# Patient Record
Sex: Female | Born: 1970 | Race: White | Hispanic: No | Marital: Married | Smoking: Never smoker
Health system: Southern US, Community
[De-identification: ages and names within clinical notes are randomized; demographics above are authoritative.]

## PROBLEM LIST (undated history)

## (undated) ENCOUNTER — Emergency Department (HOSPITAL_BASED_OUTPATIENT_CLINIC_OR_DEPARTMENT_OTHER): Admission: EM | Payer: BC Managed Care – PPO | Source: Home / Self Care

## (undated) DIAGNOSIS — R32 Unspecified urinary incontinence: Secondary | ICD-10-CM

## (undated) DIAGNOSIS — F419 Anxiety disorder, unspecified: Secondary | ICD-10-CM

## (undated) DIAGNOSIS — F329 Major depressive disorder, single episode, unspecified: Secondary | ICD-10-CM

## (undated) DIAGNOSIS — K219 Gastro-esophageal reflux disease without esophagitis: Secondary | ICD-10-CM

## (undated) DIAGNOSIS — T7840XA Allergy, unspecified, initial encounter: Secondary | ICD-10-CM

## (undated) DIAGNOSIS — K649 Unspecified hemorrhoids: Secondary | ICD-10-CM

## (undated) DIAGNOSIS — D649 Anemia, unspecified: Secondary | ICD-10-CM

## (undated) DIAGNOSIS — F32A Depression, unspecified: Secondary | ICD-10-CM

## (undated) HISTORY — PX: WISDOM TOOTH EXTRACTION: SHX21

## (undated) HISTORY — DX: Anxiety disorder, unspecified: F41.9

## (undated) HISTORY — DX: Allergy, unspecified, initial encounter: T78.40XA

## (undated) HISTORY — DX: Unspecified hemorrhoids: K64.9

## (undated) HISTORY — DX: Unspecified urinary incontinence: R32

## (undated) HISTORY — DX: Gastro-esophageal reflux disease without esophagitis: K21.9

## (undated) HISTORY — DX: Depression, unspecified: F32.A

## (undated) HISTORY — DX: Anemia, unspecified: D64.9

## (undated) HISTORY — DX: Major depressive disorder, single episode, unspecified: F32.9

---

## 2000-10-26 ENCOUNTER — Other Ambulatory Visit: Admission: RE | Admit: 2000-10-26 | Discharge: 2000-10-26 | Payer: Self-pay | Admitting: Obstetrics and Gynecology

## 2000-11-26 ENCOUNTER — Other Ambulatory Visit: Admission: RE | Admit: 2000-11-26 | Discharge: 2000-11-26 | Payer: Self-pay | Admitting: Obstetrics and Gynecology

## 2001-12-30 ENCOUNTER — Other Ambulatory Visit: Admission: RE | Admit: 2001-12-30 | Discharge: 2001-12-30 | Payer: Self-pay | Admitting: Obstetrics and Gynecology

## 2002-10-31 ENCOUNTER — Other Ambulatory Visit: Admission: RE | Admit: 2002-10-31 | Discharge: 2002-10-31 | Payer: Self-pay | Admitting: Obstetrics and Gynecology

## 2003-05-21 ENCOUNTER — Inpatient Hospital Stay (HOSPITAL_COMMUNITY): Admission: AD | Admit: 2003-05-21 | Discharge: 2003-05-24 | Payer: Self-pay | Admitting: Obstetrics and Gynecology

## 2003-05-26 ENCOUNTER — Inpatient Hospital Stay (HOSPITAL_COMMUNITY): Admission: AD | Admit: 2003-05-26 | Discharge: 2003-05-26 | Payer: Self-pay | Admitting: Obstetrics and Gynecology

## 2004-01-12 ENCOUNTER — Other Ambulatory Visit: Admission: RE | Admit: 2004-01-12 | Discharge: 2004-01-12 | Payer: Self-pay | Admitting: Obstetrics and Gynecology

## 2005-03-16 ENCOUNTER — Other Ambulatory Visit: Admission: RE | Admit: 2005-03-16 | Discharge: 2005-03-16 | Payer: Self-pay | Admitting: Obstetrics and Gynecology

## 2014-03-12 ENCOUNTER — Other Ambulatory Visit: Payer: Self-pay | Admitting: Obstetrics and Gynecology

## 2014-03-12 DIAGNOSIS — R928 Other abnormal and inconclusive findings on diagnostic imaging of breast: Secondary | ICD-10-CM

## 2014-03-24 ENCOUNTER — Ambulatory Visit
Admission: RE | Admit: 2014-03-24 | Discharge: 2014-03-24 | Disposition: A | Discharge status: Home / Self Care | Payer: Self-pay | Source: Ambulatory Visit | Attending: Obstetrics and Gynecology | Admitting: Obstetrics and Gynecology

## 2014-03-24 DIAGNOSIS — R928 Other abnormal and inconclusive findings on diagnostic imaging of breast: Secondary | ICD-10-CM

## 2014-12-18 ENCOUNTER — Encounter: Payer: Self-pay | Admitting: Family Medicine

## 2014-12-18 ENCOUNTER — Ambulatory Visit (INDEPENDENT_AMBULATORY_CARE_PROVIDER_SITE_OTHER): Payer: BC Managed Care – PPO | Admitting: Family Medicine

## 2014-12-18 ENCOUNTER — Telehealth: Payer: Self-pay | Admitting: Family Medicine

## 2014-12-18 VITALS — BP 113/77 | HR 90 | Temp 97.7°F | Resp 20 | Ht 68.0 in | Wt 153.8 lb

## 2014-12-18 DIAGNOSIS — R002 Palpitations: Secondary | ICD-10-CM

## 2014-12-18 DIAGNOSIS — Z7189 Other specified counseling: Secondary | ICD-10-CM | POA: Diagnosis not present

## 2014-12-18 DIAGNOSIS — Z0000 Encounter for general adult medical examination without abnormal findings: Secondary | ICD-10-CM | POA: Diagnosis not present

## 2014-12-18 DIAGNOSIS — R7989 Other specified abnormal findings of blood chemistry: Secondary | ICD-10-CM | POA: Insufficient documentation

## 2014-12-18 DIAGNOSIS — Z789 Other specified health status: Secondary | ICD-10-CM

## 2014-12-18 DIAGNOSIS — K5900 Constipation, unspecified: Secondary | ICD-10-CM | POA: Diagnosis not present

## 2014-12-18 DIAGNOSIS — Z7689 Persons encountering health services in other specified circumstances: Secondary | ICD-10-CM | POA: Insufficient documentation

## 2014-12-18 LAB — CBC WITH DIFFERENTIAL/PLATELET
Basophils Absolute: 0 10*3/uL (ref 0.0–0.1)
Basophils Relative: 0.6 % (ref 0.0–3.0)
EOS ABS: 0 10*3/uL (ref 0.0–0.7)
Eosinophils Relative: 0.5 % (ref 0.0–5.0)
HCT: 43.3 % (ref 36.0–46.0)
Hemoglobin: 14.2 g/dL (ref 12.0–15.0)
LYMPHS ABS: 2 10*3/uL (ref 0.7–4.0)
Lymphocytes Relative: 34.4 % (ref 12.0–46.0)
MCHC: 32.9 g/dL (ref 30.0–36.0)
MCV: 93.1 fl (ref 78.0–100.0)
MONO ABS: 0.4 10*3/uL (ref 0.1–1.0)
MONOS PCT: 6.7 % (ref 3.0–12.0)
NEUTROS ABS: 3.3 10*3/uL (ref 1.4–7.7)
Neutrophils Relative %: 57.8 % (ref 43.0–77.0)
PLATELETS: 251 10*3/uL (ref 150.0–400.0)
RBC: 4.65 Mil/uL (ref 3.87–5.11)
RDW: 12.3 % (ref 11.5–15.5)
WBC: 5.7 10*3/uL (ref 4.0–10.5)

## 2014-12-18 LAB — COMPREHENSIVE METABOLIC PANEL
ALK PHOS: 33 U/L — AB (ref 39–117)
ALT: 8 U/L (ref 0–35)
AST: 12 U/L (ref 0–37)
Albumin: 4.5 g/dL (ref 3.5–5.2)
BILIRUBIN TOTAL: 0.5 mg/dL (ref 0.2–1.2)
BUN: 9 mg/dL (ref 6–23)
CO2: 30 meq/L (ref 19–32)
CREATININE: 0.63 mg/dL (ref 0.40–1.20)
Calcium: 9.8 mg/dL (ref 8.4–10.5)
Chloride: 104 mEq/L (ref 96–112)
GFR: 110.06 mL/min (ref >60.00)
GLUCOSE: 86 mg/dL (ref 70–99)
Potassium: 4.5 mEq/L (ref 3.5–5.1)
SODIUM: 139 meq/L (ref 135–145)
TOTAL PROTEIN: 7.2 g/dL (ref 6.0–8.3)

## 2014-12-18 LAB — MAGNESIUM: MAGNESIUM: 2.1 mg/dL (ref 1.5–2.5)

## 2014-12-18 LAB — VITAMIN B12: VITAMIN B 12: 548 pg/mL (ref 211–911)

## 2014-12-18 LAB — T4, FREE: Free T4: 0.96 ng/dL (ref 0.60–1.60)

## 2014-12-18 LAB — VITAMIN D 25 HYDROXY (VIT D DEFICIENCY, FRACTURES): VITD: 18.96 ng/mL — ABNORMAL LOW (ref 30.00–100.00)

## 2014-12-18 LAB — TSH: TSH: 0.44 u[IU]/mL (ref 0.35–4.50)

## 2014-12-18 MED ORDER — VITAMIN D (ERGOCALCIFEROL) 1.25 MG (50000 UNIT) PO CAPS: 50000.0000 [IU] | ORAL_CAPSULE | ORAL | Status: DC

## 2014-12-18 NOTE — Telephone Encounter (Signed)
Please call pt: - Her vit d is extremely low at 2218. I have called in Vit d weekly supplement for her to take for 12 weeks and then we will need to retest. - She is to try the constipation regimen we discussed today and if her bowels do not regulate, I will need to see her again and consider getting image of abd.

## 2014-12-18 NOTE — Progress Notes (Signed)
Subjective:    Patient ID: Stacy Bush, female    DOB: 1970/06/20, 44 y.o.   MRN: [id number]  HPI  Patient presents for new patient establishment with complaints of chronic constipation. All past medical history, surgical history, allergies, family history, immunizations and social history was obtained from the patient today and entered into the electronic medical record. Records are requested from her prior PCP, and will be reviewed at the time they are received. All medical records will be updated at that time.  Abnormal thyroid function test/constipation: Patient states she has had an abnormal thyroid function test in early January at her gynecologist office. She reports the test was repeated, and normalized. Since then patient endorses constipation, palpitations and cold intolerance. She states she has tried to change her diet, add more fiber, vegetables, exercise, used senna and Citrucel. Patient denies any abdominal pain or bloating. She does not feel any of these helped resolve her constipation. She admits she doesn't drink much water daily, at the most 32 ounces. Patient states she used to have a bowel movement approximately every other day, and approximately 2 months ago she has the urge to have a bowel movement but is only able to produce a very small amount of hard ball formed stools. Patient reports a history of hemorrhoids. No hematochezia or melena. Abdominal pain. No family history of colon cancer or irritable bowel. Patient is a vegetarian.  Health maintenance:  Colonoscopy: No FHX, Routine screen at 50, unless symptoms.  Mammogram: last mammogram 03/2014, Fibrocystic breast. Will need 3D.  Cervical cancer screening: PAPs normal, lHPV negative, last PAP 2016, has GYN Immunizations: Tdap 2014, Flu indicated Infectious disease screening: HIV indicated   Past Medical History  Diagnosis Date  . Depression   . Anxiety   . Allergy   . Anemia   . Urinary incontinence     pt states  with excercise only.    No Known Allergies Past Surgical History  Procedure Laterality Date  . Wisdom tooth extraction     Family History  Problem Relation Age of Onset  . Hearing loss Mother   . COPD Father   . Heart disease Father   . Drug abuse Brother   . Alzheimer's disease Maternal Grandfather   . Diabetes Paternal Grandmother   . Drug abuse Paternal Grandfather   . Lung cancer Paternal Grandfather   . Breast cancer Neg Hx   . Colon cancer Neg Hx    Social History   Social History  . Marital Status: Married    Spouse Name: N/A  . Number of Children: N/A  . Years of Education: N/A   Occupational History  . Not on file.   Social History Main Topics  . Smoking status: Never Smoker   . Smokeless tobacco: Never Used  . Alcohol Use: No  . Drug Use: No  . Sexual Activity: Yes     Comment: husband vasectomy   Other Topics Concern  . Not on file   Social History Narrative   Married. Spouse's name is Engineer, technical sales. They have 1 child, named Chief Executive Officer .    Firefighter. Master's Degree.   Patient drinks caffeine, uses herbal remedies, takes a daily vitamin.   Patient wears her seatbelt, she wears a bike helmet. She exercises at least 3 times a week.   Patient does report a vegetarian diet.   There is a smoke detector in her home, she feels safe in her relationships.   Review of Systems Negative, with  the exception of above mentioned in HPI     Objective:   Physical Exam BP 113/77 mmHg  Pulse 90  Temp(Src) 97.7 F (36.5 C) (Tympanic)  Resp 20  Ht '5\' 8"'  (1.727 m)  Wt 153 lb 12 oz (69.741 kg)  BMI 23.38 kg/m2  SpO2 98%  LMP 12/10/2014 Gen: Afebrile. No acute distress. Nontoxic in appearance. Pleasant caucasian female. Physically fit.  HENT: AT. [place]. Bilateral TM visualized and normal in appearance. MMM. Bilateral nares without erythema or swelling. Septum midline. Throat without erythema or exudates. Good dentition.  Eyes:Pupils Equal Round Reactive to light,  Extraocular movements intact,  Conjunctiva without redness, discharge or icterus. Neck/lymp/endocrine: Supple, no ymphadenopathy, mild right enlargement of thyroid  CV: RRR No murmur appreciated, no rubs or gallops, No LE edema, +2/4 P posterior tibialis pulses Chest: CTAB, no wheeze or crackles Abd: Soft. flat. NTND. BS present. Moderate stool burden palpated right abdomen. No Hepatosplenomegaly.  MSK: No obvious deformities, no erythema or swelling of joints. Full ROM. Neurovascularly intact distally.  Lumbar spine: L3 rotated left,  Skin: No rashes, purpura or petechiae.  Neuro: Normal gait. PERLA. EOMi. Alert. Oriented. Cranial nerves II through XII intact. Muscle strength 5/5 UE/LE . DTRs equal bilaterally. Psych: Normal affect, dress and demeanor. Normal speech. Normal thought content and judgment..      Assessment & Plan:  Stacy Bush is a 44 y.o. female presents to OV for establishment of care with complaints of constipation. Constipation, unspecified constipation type - Bowel habit changes within the last 2 months. Patient encouraged to use Mira lax 1 capful daily with 8 ounces of water. If this doesn't regulate her bowel movements, she can attempt to capfuls twice a day with 8 ounces of water - Patient is to drink at least 60-80 ounces of water a day. - TSH collected - Consider KUB/abdominal films if condition persists.  Palpitations - intermittent palpitations. Patient encouraged to monitor caffeine use. Patient has had mildly elevated TSH January, with repeat testing normal. - CBC w/Diff - Comp Met (CMET) - Magnesium - TSH  Health maintenance/encounter for preventive exam: - AVS for age/gender appropriate health maintenance provided to patient today. - Patient is very active, physically fit, and appears to be a well-balanced diet. Colonoscopy: No FHX, Routine screen at 50, unless symptoms.  Mammogram: last mammogram 03/2014, Fibrocystic breast. Will need 3D yearly Cervical  cancer screening: PAPs normal, last 2016, HPV negative has GYN Immunizations: Tdap 2014, Flu indicated--> declined Infectious disease screening: HIV indicated --> declined  Vegetarian diet - B12 - Vitamin D (25 hydroxy)  Abnormal TSH - recheck for abnormalities in thyroid function secondary to symptoms of palpitation, cold intolerance and constipation.  - TSH - T4, free  F/U dependent on labs and symptom resolution

## 2014-12-18 NOTE — Patient Instructions (Addendum)
Constipation, Adult Constipation is when a person has fewer than three bowel movements a week, has difficulty having a bowel movement, or has stools that are dry, hard, or larger than normal. As people grow older, constipation is more common. A low-fiber diet, not taking in enough fluids, and taking certain medicines may make constipation worse.  CAUSES   Certain medicines, such as antidepressants, pain medicine, iron supplements, antacids, and water pills.   Certain diseases, such as diabetes, irritable bowel syndrome (IBS), thyroid disease, or depression.   Not drinking enough water.   Not eating enough fiber-rich foods.   Stress or travel.   Lack of physical activity or exercise.   Ignoring the urge to have a bowel movement.   Using laxatives too much.  SIGNS AND SYMPTOMS   Having fewer than three bowel movements a week.   Straining to have a bowel movement.   Having stools that are hard, dry, or larger than normal.   Feeling full or bloated.   Pain in the lower abdomen.   Not feeling relief after having a bowel movement.  DIAGNOSIS  Your health care provider will take a medical history and perform a physical exam. Further testing may be done for severe constipation. Some tests may include:  A barium enema X-ray to examine your rectum, colon, and, sometimes, your small intestine.   A sigmoidoscopy to examine your lower colon.   A colonoscopy to examine your entire colon. TREATMENT  Treatment will depend on the severity of your constipation and what is causing it. Some dietary treatments include drinking more fluids and eating more fiber-rich foods. Lifestyle treatments may include regular exercise. If these diet and lifestyle recommendations do not help, your health care provider may recommend taking over-the-counter laxative medicines to help you have bowel movements. Prescription medicines may be prescribed if over-the-counter medicines do not work.   HOME CARE INSTRUCTIONS   Eat foods that have a lot of fiber, such as fruits, vegetables, whole grains, and beans.  Limit foods high in fat and processed sugars, such as french fries, hamburgers, cookies, candies, and soda.   A fiber supplement may be added to your diet if you cannot get enough fiber from foods.   Drink enough fluids to keep your urine clear or pale yellow.   Exercise regularly or as directed by your health care provider.   Go to the restroom when you have the urge to go. Do not hold it.   Only take over-the-counter or prescription medicines as directed by your health care provider. Do not take other medicines for constipation without talking to your health care provider first.  [place] IF:   You have bright red blood in your stool.   Your constipation lasts for more than 4 days or gets worse.   You have abdominal or rectal pain.   You have thin, pencil-like stools.   You have unexplained weight loss. MAKE SURE YOU:   Understand these instructions.  Will watch your condition.  Will get help right away if you are not doing well or get worse.   This information is not intended to replace advice given to you by your health care provider. Make sure you discuss any questions you have with your health care provider.   Document Released: 10/22/2003 Document Revised: 02/13/2014 Document Reviewed: 11/04/2012 Elsevier Interactive Patient Education 2016 [place] Maintenance, Female Adopting a healthy lifestyle and getting preventive care can go a long way to promote health and  wellness. Talk with your health care provider about what schedule of regular examinations is right for you. This is a good chance for you to check in with your provider about disease prevention and staying healthy. In between checkups, there are plenty of things you can do on your own. Experts have done a lot of research about which lifestyle changes  and preventive measures are most likely to keep you healthy. Ask your health care provider for more information. WEIGHT AND DIET  Eat a healthy diet  Be sure to include plenty of vegetables, fruits, low-fat dairy products, and lean protein.  Do not eat a lot of foods high in solid fats, added sugars, or salt.  Get regular exercise. This is one of the most important things you can do for your health.  Most adults should exercise for at least 150 minutes each week. The exercise should increase your heart rate and make you sweat (moderate-intensity exercise).  Most adults should also do strengthening exercises at least twice a week. This is in addition to the moderate-intensity exercise.  Maintain a healthy weight  Body mass index (BMI) is a measurement that can be used to identify possible weight problems. It estimates body fat based on height and weight. Your health care provider can help determine your BMI and help you achieve or maintain a healthy weight.  For females 69 years of age and older:   A BMI below 18.5 is considered underweight.  A BMI of 18.5 to 24.9 is normal.  A BMI of 25 to 29.9 is considered overweight.  A BMI of 30 and above is considered obese.  Watch levels of cholesterol and blood lipids  You should start having your blood tested for lipids and cholesterol at 42 years of age, then have this test every 5 years.  You may need to have your cholesterol levels checked more often if:  Your lipid or cholesterol levels are high.  You are older than 43 years of age.  You are at high risk for heart disease.  CANCER SCREENING   Lung Cancer  Lung cancer screening is recommended for adults 54-82 years old who are at high risk for lung cancer because of a history of smoking.  A yearly low-dose CT scan of the lungs is recommended for people who:  Currently smoke.  Have quit within the past 15 years.  Have at least a 30-pack-year history of smoking. A pack  year is smoking an average of one pack of cigarettes a day for 1 year.  Yearly screening should continue until it has been 15 years since you quit.  Yearly screening should stop if you develop a health problem that would prevent you from having lung cancer treatment.  Breast Cancer  Practice breast self-awareness. This means understanding how your breasts normally appear and feel.  It also means doing regular breast self-exams. Let your health care provider know about any changes, no matter how small.  If you are in your 20s or 30s, you should have a clinical breast exam (CBE) by a health care provider every 1-3 years as part of a regular health exam.  If you are 71 or older, have a CBE every year. Also consider having a breast X-ray (mammogram) every year.  If you have a family history of breast cancer, talk to your health care provider about genetic screening.  If you are at high risk for breast cancer, talk to your health care provider about having an MRI and a  mammogram every year.  Breast cancer gene (BRCA) assessment is recommended for women who have family members with BRCA-related cancers. BRCA-related cancers include:  Breast.  Ovarian.  Tubal.  Peritoneal cancers.  Results of the assessment will determine the need for genetic counseling and BRCA1 and BRCA2 testing. Cervical Cancer Your health care provider may recommend that you be screened regularly for cancer of the pelvic organs (ovaries, uterus, and vagina). This screening involves a pelvic examination, including checking for microscopic changes to the surface of your cervix (Pap test). You may be encouraged to have this screening done every 3 years, beginning at age 80.  For women ages 26-65, health care providers may recommend pelvic exams and Pap testing every 3 years, or they may recommend the Pap and pelvic exam, combined with testing for human papilloma virus (HPV), every 5 years. Some types of HPV increase your  risk of cervical cancer. Testing for HPV may also be done on women of any age with unclear Pap test results.  Other health care providers may not recommend any screening for nonpregnant women who are considered low risk for pelvic cancer and who do not have symptoms. Ask your health care provider if a screening pelvic exam is right for you.  If you have had past treatment for cervical cancer or a condition that could lead to cancer, you need Pap tests and screening for cancer for at least 20 years after your treatment. If Pap tests have been discontinued, your risk factors (such as having a new sexual partner) need to be reassessed to determine if screening should resume. Some women have medical problems that increase the chance of getting cervical cancer. In these cases, your health care provider may recommend more frequent screening and Pap tests. Colorectal Cancer  This type of cancer can be detected and often prevented.  Routine colorectal cancer screening usually begins at 42 years of age and continues through 42 years of age.  Your health care provider may recommend screening at an earlier age if you have risk factors for colon cancer.  Your health care provider may also recommend using home test kits to check for hidden blood in the stool.  A small camera at the end of a tube can be used to examine your colon directly (sigmoidoscopy or colonoscopy). This is done to check for the earliest forms of colorectal cancer.  Routine screening usually begins at age 37.  Direct examination of the colon should be repeated every 5-10 years through 42 years of age. However, you may need to be screened more often if early forms of precancerous polyps or small growths are found. Skin Cancer  Check your skin from head to toe regularly.  Tell your health care provider about any new moles or changes in moles, especially if there is a change in a mole's shape or color.  Also tell your health care  provider if you have a mole that is larger than the size of a pencil eraser.  Always use sunscreen. Apply sunscreen liberally and repeatedly throughout the day.  Protect yourself by wearing long sleeves, pants, a wide-brimmed hat, and sunglasses whenever you are outside. HEART DISEASE, DIABETES, AND HIGH BLOOD PRESSURE   High blood pressure causes heart disease and increases the risk of stroke. High blood pressure is more likely to develop in:  People who have blood pressure in the high end of the normal range (130-139/85-89 mm Hg).  People who are overweight or obese.  People who are African  American.  If you are 68-49 years of age, have your blood pressure checked every 3-5 years. If you are 64 years of age or older, have your blood pressure checked every year. You should have your blood pressure measured twice--once when you are at a hospital or clinic, and once when you are not at a hospital or clinic. Record the average of the two measurements. To check your blood pressure when you are not at a hospital or clinic, you can use:  An automated blood pressure machine at a pharmacy.  A home blood pressure monitor.  If you are between 75 years and 45 years old, ask your health care provider if you should take aspirin to prevent strokes.  Have regular diabetes screenings. This involves taking a blood sample to check your fasting blood sugar level.  If you are at a normal weight and have a low risk for diabetes, have this test once every three years after 42 years of age.  If you are overweight and have a high risk for diabetes, consider being tested at a younger age or more often. PREVENTING INFECTION  Hepatitis B  If you have a higher risk for hepatitis B, you should be screened for this virus. You are considered at high risk for hepatitis B if:  You were born in a country where hepatitis B is common. Ask your health care provider which countries are considered high risk.  Your  parents were born in a high-risk country, and you have not been immunized against hepatitis B (hepatitis B vaccine).  You have HIV or AIDS.  You use needles to inject street drugs.  You live with someone who has hepatitis B.  You have had sex with someone who has hepatitis B.  You get hemodialysis treatment.  You take certain medicines for conditions, including cancer, organ transplantation, and autoimmune conditions. Hepatitis C  Blood testing is recommended for:  Everyone born from [birth year] through 1963.  Anyone with known risk factors for hepatitis C. Sexually transmitted infections (STIs)  You should be screened for sexually transmitted infections (STIs) including gonorrhea and chlamydia if:  You are sexually active and are younger than 42 years of age.  You are older than 42 years of age and your health care provider tells you that you are at risk for this type of infection.  Your sexual activity has changed since you were last screened and you are at an increased risk for chlamydia or gonorrhea. Ask your health care provider if you are at risk.  If you do not have HIV, but are at risk, it may be recommended that you take a prescription medicine daily to prevent HIV infection. This is called pre-exposure prophylaxis (PrEP). You are considered at risk if:  You are sexually active and do not regularly use condoms or know the HIV status of your partner(s).  You take drugs by injection.  You are sexually active with a partner who has HIV. Talk with your health care provider about whether you are at high risk of being infected with HIV. If you choose to begin PrEP, you should first be tested for HIV. You should then be tested every 3 months for as long as you are taking PrEP.  PREGNANCY   If you are premenopausal and you may become pregnant, ask your health care provider about preconception counseling.  If you may become pregnant, take 400 to 800 micrograms (mcg) of folic acid  every day.  If you want to prevent  pregnancy, talk to your health care provider about birth control (contraception). OSTEOPOROSIS AND MENOPAUSE   Osteoporosis is a disease in which the bones lose minerals and strength with aging. This can result in serious bone fractures. Your risk for osteoporosis can be identified using a bone density scan.  If you are 20 years of age or older, or if you are at risk for osteoporosis and fractures, ask your health care provider if you should be screened.  Ask your health care provider whether you should take a calcium or vitamin D supplement to lower your risk for osteoporosis.  Menopause may have certain physical symptoms and risks.  Hormone replacement therapy may reduce some of these symptoms and risks. Talk to your health care provider about whether hormone replacement therapy is right for you.  HOME CARE INSTRUCTIONS   Schedule regular health, dental, and eye exams.  Stay current with your immunizations.   Do not use any tobacco products including cigarettes, chewing tobacco, or electronic cigarettes.  If you are pregnant, do not drink alcohol.  If you are breastfeeding, limit how much and how often you drink alcohol.  Limit alcohol intake to no more than 1 drink per day for nonpregnant women. One drink equals 12 ounces of beer, 5 ounces of wine, or 1 ounces of hard liquor.  Do not use street drugs.  Do not share needles.  Ask your health care provider for help if you need support or information about quitting drugs.  Tell your health care provider if you often feel depressed.  Tell your health care provider if you have ever been abused or do not feel safe at home.   This information is not intended to replace advice given to you by your health care provider. Make sure you discuss any questions you have with your health care provider.   Document Released: 08/08/2010 Document Revised: 02/13/2014 Document Reviewed: 12/25/2012 Elsevier  Interactive Patient Education [employer].   It was a pleasure meeting you today.  Miralax 1 cap full in 8 ounces of water daily. Taper to bowel movements.  Drink at least 60- 80 ounces of water a day. We will call you with your labs and discuss if we need to get imaging.

## 2014-12-21 ENCOUNTER — Encounter: Payer: Self-pay | Admitting: Family Medicine

## 2014-12-21 ENCOUNTER — Telehealth: Payer: Self-pay | Admitting: Family Medicine

## 2014-12-21 DIAGNOSIS — R109 Unspecified abdominal pain: Secondary | ICD-10-CM

## 2014-12-21 NOTE — Telephone Encounter (Signed)
Left message with information on patient voice mail 

## 2014-12-21 NOTE — Telephone Encounter (Signed)
Spoke with patient reviewed lab results and instructions.She is trying constipation regimen and has sent a my chart message regarding her progress per patient.

## 2014-12-21 NOTE — Telephone Encounter (Signed)
Pt advised through mychart to obtain xray of abd. Please call and encourage her to get this today if able and schedule a  follow up appt within a few days. The order has been placed for [facility]

## 2014-12-22 ENCOUNTER — Telehealth: Payer: Self-pay | Admitting: Family Medicine

## 2014-12-22 ENCOUNTER — Ambulatory Visit (HOSPITAL_BASED_OUTPATIENT_CLINIC_OR_DEPARTMENT_OTHER)
Admission: RE | Admit: 2014-12-22 | Discharge: 2014-12-22 | Disposition: A | Discharge status: Home / Self Care | Payer: BC Managed Care – PPO | Source: Ambulatory Visit | Attending: Family Medicine | Admitting: Family Medicine

## 2014-12-22 DIAGNOSIS — K5900 Constipation, unspecified: Secondary | ICD-10-CM

## 2014-12-22 DIAGNOSIS — R109 Unspecified abdominal pain: Secondary | ICD-10-CM

## 2014-12-22 MED ORDER — SENNOSIDES-DOCUSATE SODIUM 8.6-50 MG PO TABS: 2.0000 | ORAL_TABLET | Freq: Every day | ORAL | Status: DC

## 2014-12-22 MED ORDER — BISACODYL 10 MG RE SUPP: 10.0000 mg | RECTAL | Status: DC | PRN

## 2014-12-22 MED ORDER — ALIGN 4 MG PO CAPS: 1.0000 | ORAL_CAPSULE | Freq: Every day | ORAL | Status: DC

## 2014-12-22 NOTE — Telephone Encounter (Signed)
Please call pt: - her xray results showed stool throughout her entire colon. She needs to continue a bowel regimen and make certain she is drinking appropriate fluids as we discussed.  - Do  Not take any extra fiber supplement. - The following medications have been called into her pharmacy: - Senna-docusate, 2 tabs daily. This will stimulate her colon. She should continue this medication for at least a month and may need daily dose long term.  - dulcolax suppository: use once daily until good BM achieved.  - I would like her to start a probiotic as well called Align, for 8 weeks, this will help promote a healthier digestive system. - Keep F/u on Friday.

## 2014-12-22 NOTE — Telephone Encounter (Signed)
Left message for patient to return call.

## 2014-12-22 NOTE — Telephone Encounter (Signed)
Reviewed xray results and all instructions with patient . Patient states she did have another large BM this afternoon. She will keep her appt Friday. Patient verbalized understanding of all instructions.

## 2014-12-25 ENCOUNTER — Encounter: Payer: Self-pay | Admitting: Family Medicine

## 2014-12-25 ENCOUNTER — Ambulatory Visit (INDEPENDENT_AMBULATORY_CARE_PROVIDER_SITE_OTHER): Payer: BC Managed Care – PPO | Admitting: Family Medicine

## 2014-12-25 VITALS — BP 104/70 | HR 89 | Temp 98.2°F | Resp 20 | Wt 154.0 lb

## 2014-12-25 DIAGNOSIS — K5901 Slow transit constipation: Secondary | ICD-10-CM | POA: Diagnosis not present

## 2014-12-25 NOTE — Patient Instructions (Signed)
Use align for  8 weeks. Use 2 senna for 1 month, then 1 month of 1 senna daily.  Continue miralax 1/2 cap to 1 cap daily, if needed Continue water intake daily.  If not regular without need of meds in 2 months, or you desire, we will place a referral to PT/Pelvic floor.

## 2014-12-25 NOTE — Progress Notes (Signed)
   Subjective:    Patient ID: Stacy Bush, female    DOB: 1970/10/09, 44 y.o.   MRN: [id number]  HPI CC: Constipation F/u Constipation: Patient reports on the 4th dose of miralax she was experiencing early satiety and nausea. She was also drinking the 70 ounces of water daily. She then had a "very large" bowel movement and, and nausea subsided. She had her abd xray completed after her BM and it showed stool throughout entire colon. She then had another large BM after xray. She has continued to have a moderate BM daily since. She did stop the miralax and started the align, senna-d, and increased water. She feels like she is now bloated.  Discussed pelvi floor dysfunction and the possibility of it can cause incontinence (which is has) and difficulties with regulation of bowels. Patient is agreeable to consider if bowels do not regulate after current therapy.  Patient also admits to an unusual amount of stress over the summer that may have started her constipation.   Never smoker  Past Medical History  Diagnosis Date  . Depression   . Anxiety   . Allergy   . Anemia   . Urinary incontinence     pt states with excercise only.    No Known Allergies  Review of Systems Negative, with the exception of above mentioned in HPI     Objective:   Physical Exam BP 104/70 mmHg  Pulse 89  Temp(Src) 98.2 F (36.8 C) (Tympanic)  Resp 20  Wt 154 lb (69.854 kg)  SpO2 97%  LMP 12/10/2014 Gen: Afebrile. No acute distress.  HENT: AT. [place]. MMM. Eyes:Pupils Equal Round Reactive to light, Extraocular movements intact,  Conjunctiva without redness, discharge or icterus. CV: RRR  Chest: CTAB, no wheeze or crackles Abd: Soft.flat. NTND. BS present. No Masses palpated. Moderate stool burden remains R>L abdomen.     Assessment & Plan:  Slow transit constipation Use align for  8 weeks. Use 2 senna for 1 month, then 1 month of 1 senna daily.  Continue miralax 1/2 cap to 1 cap daily, if needed Continue   Increased water intake daily.  If not regular without need of meds in 2 months will refer to Pt/pelvic floor dysfunction/alliance.

## 2015-01-18 ENCOUNTER — Encounter: Payer: Self-pay | Admitting: Family Medicine

## 2015-02-22 ENCOUNTER — Ambulatory Visit (INDEPENDENT_AMBULATORY_CARE_PROVIDER_SITE_OTHER): Payer: BC Managed Care – PPO | Admitting: Family Medicine

## 2015-02-22 ENCOUNTER — Encounter: Payer: Self-pay | Admitting: Family Medicine

## 2015-02-22 VITALS — BP 109/75 | HR 83 | Temp 98.0°F | Resp 20 | Wt 155.8 lb

## 2015-02-22 DIAGNOSIS — R0781 Pleurodynia: Secondary | ICD-10-CM | POA: Diagnosis not present

## 2015-02-22 DIAGNOSIS — K5900 Constipation, unspecified: Secondary | ICD-10-CM

## 2015-02-22 MED ORDER — SENNOSIDES-DOCUSATE SODIUM 8.6-50 MG PO TABS: 2.0000 | ORAL_TABLET | Freq: Every day | ORAL | Status: DC

## 2015-02-22 MED ORDER — ALIGN 4 MG PO CAPS: 1.0000 | ORAL_CAPSULE | Freq: Every day | ORAL | Status: DC

## 2015-02-22 NOTE — Progress Notes (Signed)
   Subjective:    Patient ID: Stacy MuldersJill S Market, female    DOB: 1970/09/30, 45 y.o.   MRN: [id number]  HPI   Left breast/chest pain: Patient presents for an acute office visit with left breast pain of 3 weeks in duration. Patient has never experienced pain like this in the past. SHe states it would happen a couple times a day, and now is up to 6 times a day. She reports it feels like a "stitch" when experiences when running in their side. SHe states the pain lasts only a couple seconds and at the most 1 minute. She denies any radiation of pain, diaphoresis, nausea, vomit, shortness of breath, fever, recent URI or dizziness. Patient does endorse a more sedentary lifestyle over the last month. She denies any fever or palpitations. She denies history of arrhythmia. She states she's had many EKGs, for a  "pounding heart "they have all been normal. She has no history of DVTs. She states the pain is worse with movement, especially bending and twisting. She describes the pain as a stabbing, twinge-like pain in her left breast/chest wall. She states that she feels it coming on she can take a fairly deep breath and stretch out her chest wall and she can prevent the pain from happening.   Past Medical History  Diagnosis Date  . Depression   . Anxiety   . Allergy   . Anemia   . Urinary incontinence     pt states with excercise only.    No Known Allergies  Past Surgical History  Procedure Laterality Date  . Wisdom tooth extraction       Review of Systems Negative, with the exception of above mentioned in HPI     Objective:   Physical Exam BP 109/75 mmHg  Pulse 83  Temp(Src) 98 F (36.7 C) (Oral)  Resp 20  Wt 155 lb 12 oz (70.648 kg)  SpO2 99% Gen: Afebrile. No acute distress. Nontoxic in appearance, well-developed, well-nourished, Caucasian female. Comfortable. HENT: AT. [place]. Bilateral TM visualized and normal in appearance. MMM. Bilateral nares without erythema or swelling. Throat without  erythema or exudates.  Eyes:Pupils Equal Round Reactive to light, Extraocular movements intact,  Conjunctiva without redness, discharge or icterus. Neck/lymp/endocrine: Supple, no lymphadenopathy, no thyromegaly CV: RRR no murmurs, clicks, gallops or rubs. No edema Chest: CTAB, no wheeze or crackles. Normal respiratory effort, good air movement. No cough on exam. No pain with deep inspiration. Neuro: Normal gait. PERLA. EOMi. Alert. Oriented x3 Psych: Normal affect, dress and demeanor. Normal speech. Normal thought content and judgment..      Assessment & Plan:  1. Pleuritic chest pain - Aleve 1 tab every 12 hours 5 days. No red flags in history or on exam today. Discussed with patient possible etiologies of pleurisy, pericardial catch, lung infection etc. - Exam normal today. Urged patient to use Aleve every 12 hours for 5 days for anti-inflammatory. - If pain worsens, radiates, experiences diaphoresis or nausea with pain patient is to be seen immediately. - DG Chest 2 View; Future - Follow-up in 2 weeks.  2. Constipation, unspecified constipation type - Constipation has improved, refills on senna and align today. - senna-docusate (SENOKOT-S) 8.6-50 MG tablet; Take 2 tablets by mouth daily.  Dispense: 60 tablet; Refill: 1 - Probiotic Product (ALIGN) 4 MG CAPS; Take 1 capsule (4 mg total) by mouth daily.  Dispense: 30 capsule; Refill: 1

## 2015-02-22 NOTE — Patient Instructions (Signed)
Please get a cxr within the next few days.  Take naproxen 1 tab every 12 hours for 5 days.  Nonspecific Chest Pain  Chest pain can be caused by many different conditions. There is always a chance that your pain could be related to something serious, such as a heart attack or a blood clot in your lungs. Chest pain can also be caused by conditions that are not life-threatening. If you have chest pain, it is very important to follow up with your health care provider. CAUSES  Chest pain can be caused by:  Heartburn.  Pneumonia or bronchitis.  Anxiety or stress.  Inflammation around your heart (pericarditis) or lung (pleuritis or pleurisy).  A blood clot in your lung.  A collapsed lung (pneumothorax). It can develop suddenly on its own (spontaneous pneumothorax) or from trauma to the chest.  Shingles infection (varicella-zoster virus).  Heart attack.  Damage to the bones, muscles, and cartilage that make up your chest wall. This can include:  Bruised bones due to injury.  Strained muscles or cartilage due to frequent or repeated coughing or overwork.  Fracture to one or more ribs.  Sore cartilage due to inflammation (costochondritis). RISK FACTORS  Risk factors for chest pain may include:  Activities that increase your risk for trauma or injury to your chest.  Respiratory infections or conditions that cause frequent coughing.  Medical conditions or overeating that can cause heartburn.  Heart disease or family history of heart disease.  Conditions or health behaviors that increase your risk of developing a blood clot.  Having had chicken pox (varicella zoster). SIGNS AND SYMPTOMS Chest pain can feel like:  Burning or tingling on the surface of your chest or deep in your chest.  Crushing, pressure, aching, or squeezing pain.  Dull or sharp pain that is worse when you move, cough, or take a deep breath.  Pain that is also felt in your back, neck, shoulder, or arm, or  pain that spreads to any of these areas. Your chest pain may come and go, or it may stay constant. DIAGNOSIS Lab tests or other studies may be needed to find the cause of your pain. Your health care provider may have you take a test called an ambulatory ECG (electrocardiogram). An ECG records your heartbeat patterns at the time the test is performed. You may also have other tests, such as:  Transthoracic echocardiogram (TTE). During echocardiography, sound waves are used to create a picture of all of the heart structures and to look at how blood flows through your heart.  Transesophageal echocardiogram (TEE).This is a more advanced imaging test that obtains images from inside your body. It allows your health care provider to see your heart in finer detail.  Cardiac monitoring. This allows your health care provider to monitor your heart rate and rhythm in real time.  Holter monitor. This is a portable device that records your heartbeat and can help to diagnose abnormal heartbeats. It allows your health care provider to track your heart activity for several days, if needed.  Stress tests. These can be done through exercise or by taking medicine that makes your heart beat more quickly.  Blood tests.  Imaging tests. TREATMENT  Your treatment depends on what is causing your chest pain. Treatment may include:  Medicines. These may include:  Acid blockers for heartburn.  Anti-inflammatory medicine.  Pain medicine for inflammatory conditions.  Antibiotic medicine, if an infection is present.  Medicines to dissolve blood clots.  Medicines to treat  coronary artery disease.  Supportive care for conditions that do not require medicines. This may include:  Resting.  Applying heat or cold packs to injured areas.  Limiting activities until pain decreases. HOME CARE INSTRUCTIONS  If you were prescribed an antibiotic medicine, finish it all even if you start to feel better.  Avoid any  activities that bring on chest pain.  Do not use any tobacco products, including cigarettes, chewing tobacco, or electronic cigarettes. If you need help quitting, ask your health care provider.  Do not drink alcohol.  Take medicines only as directed by your health care provider.  Keep all follow-up visits as directed by your health care provider. This is important. This includes any further testing if your chest pain does not go away.  If heartburn is the cause for your chest pain, you may be told to keep your head raised (elevated) while sleeping. This reduces the chance that acid will go from your stomach into your esophagus.  Make lifestyle changes as directed by your health care provider. These may include:  Getting regular exercise. Ask your health care provider to suggest some activities that are safe for you.  Eating a heart-healthy diet. A registered dietitian can help you to learn healthy eating options.  Maintaining a healthy weight.  Managing diabetes, if necessary.  Reducing stress. SEEK MEDICAL CARE IF:  Your chest pain does not go away after treatment.  You have a rash with blisters on your chest.  You have a fever. SEEK IMMEDIATE MEDICAL CARE IF:   Your chest pain is worse.  You have an increasing cough, or you cough up blood.  You have severe abdominal pain.  You have severe weakness.  You faint.  You have chills.  You have sudden, unexplained chest discomfort.  You have sudden, unexplained discomfort in your arms, back, neck, or jaw.  You have shortness of breath at any time.  You suddenly start to sweat, or your skin gets clammy.  You feel nauseous or you vomit.  You suddenly feel light-headed or dizzy.  Your heart begins to beat quickly, or it feels like it is skipping beats. These symptoms may represent a serious problem that is an emergency. Do not wait to see if the symptoms will go away. Get medical help right away. Call your local  emergency services (911 in the [place] Do not drive yourself to the hospital.   This information is not intended to replace advice given to you by your health care provider. Make sure you discuss any questions you have with your health care provider.   Document Released: 11/02/2004 Document Revised: 02/13/2014 Document Reviewed: 08/29/2013 Elsevier Interactive Patient Education [employer]2016 [employer]

## 2015-02-24 ENCOUNTER — Ambulatory Visit (HOSPITAL_BASED_OUTPATIENT_CLINIC_OR_DEPARTMENT_OTHER)
Admission: RE | Admit: 2015-02-24 | Discharge: 2015-02-24 | Disposition: A | Discharge status: Home / Self Care | Payer: BC Managed Care – PPO | Source: Ambulatory Visit | Attending: Family Medicine | Admitting: Family Medicine

## 2015-02-24 ENCOUNTER — Ambulatory Visit: Payer: BC Managed Care – PPO | Admitting: Family Medicine

## 2015-02-24 ENCOUNTER — Telehealth: Payer: Self-pay | Admitting: Family Medicine

## 2015-02-24 ENCOUNTER — Encounter: Payer: Self-pay | Admitting: Family Medicine

## 2015-02-24 DIAGNOSIS — R0781 Pleurodynia: Secondary | ICD-10-CM | POA: Diagnosis present

## 2015-02-24 DIAGNOSIS — R9389 Abnormal findings on diagnostic imaging of other specified body structures: Secondary | ICD-10-CM

## 2015-02-24 DIAGNOSIS — R918 Other nonspecific abnormal finding of lung field: Secondary | ICD-10-CM | POA: Insufficient documentation

## 2015-02-24 MED ORDER — AZITHROMYCIN 250 MG PO TABS: ORAL_TABLET | ORAL | Status: DC

## 2015-02-24 MED ORDER — PREDNISONE 50 MG PO TABS: ORAL_TABLET | ORAL | Status: DC

## 2015-02-24 NOTE — Telephone Encounter (Signed)
Please call pt: - Please call pt. She has no structural abnormality on her cxr. It did show a mild, but present RML infiltrate (poss. Pneumonia). I would like to treat her with a z-pack and short burst of prednisone. Please have stop the aleve during the prednisone use.   - I will need to follow up with her in 3-4 weeks for repeat imagining and eval. Future order place. Please have her obtain prior to appt if possible.

## 2015-02-25 NOTE — Telephone Encounter (Signed)
Spoke with patient reviewed xray results and instructions . Patient to call back to schedule follow up appt . Patient is aware she will need repeat chest xray a few days prior to follow up appt.

## 2015-03-03 ENCOUNTER — Telehealth: Payer: Self-pay | Admitting: Family Medicine

## 2015-03-03 NOTE — Telephone Encounter (Signed)
Patient just completed steroid Rx and is having cramps today. She would like to know if she can take Aleve or ibuprofen?

## 2015-03-03 NOTE — Telephone Encounter (Signed)
Spoke with patient ok to take Nsaid if prednisone completed. Patient states she has completed prednisone.

## 2015-03-04 ENCOUNTER — Telehealth: Payer: Self-pay | Admitting: Family Medicine

## 2015-03-04 ENCOUNTER — Encounter: Payer: Self-pay | Admitting: Family Medicine

## 2015-03-04 ENCOUNTER — Ambulatory Visit (INDEPENDENT_AMBULATORY_CARE_PROVIDER_SITE_OTHER): Payer: BC Managed Care – PPO | Admitting: Family Medicine

## 2015-03-04 VITALS — BP 116/83 | HR 96 | Temp 98.1°F | Resp 20 | Wt 154.8 lb

## 2015-03-04 DIAGNOSIS — J0100 Acute maxillary sinusitis, unspecified: Secondary | ICD-10-CM

## 2015-03-04 DIAGNOSIS — H8113 Benign paroxysmal vertigo, bilateral: Secondary | ICD-10-CM

## 2015-03-04 DIAGNOSIS — R5383 Other fatigue: Secondary | ICD-10-CM

## 2015-03-04 DIAGNOSIS — R0789 Other chest pain: Secondary | ICD-10-CM | POA: Diagnosis not present

## 2015-03-04 DIAGNOSIS — R42 Dizziness and giddiness: Secondary | ICD-10-CM

## 2015-03-04 DIAGNOSIS — J320 Chronic maxillary sinusitis: Secondary | ICD-10-CM | POA: Insufficient documentation

## 2015-03-04 DIAGNOSIS — H8110 Benign paroxysmal vertigo, unspecified ear: Secondary | ICD-10-CM | POA: Insufficient documentation

## 2015-03-04 MED ORDER — DOXYCYCLINE HYCLATE 100 MG PO TABS: 100.0000 mg | ORAL_TABLET | Freq: Two times a day (BID) | ORAL | Status: DC

## 2015-03-04 MED ORDER — FLUTICASONE PROPIONATE 50 MCG/ACT NA SUSP: 2.0000 | Freq: Every day | NASAL | Status: DC

## 2015-03-04 NOTE — Telephone Encounter (Signed)
Please call patient, I would like to collect a CBC and BMP, to monitor for signs of anemia and electrolyte imbalance. The pathologist patient, them into place this during her office visit today and it did not get completed prior to her leaving. She is able to appreciated she have labs completed Friday.

## 2015-03-04 NOTE — Progress Notes (Signed)
Patient ID: Stacy Bush, female   DOB: 1970/06/03, 45 y.o.   MRN: [id number]   Subjective:    Patient ID: Stacy Bush, female    DOB: 1970/07/27, 45 y.o.   MRN: [id number]  HPI   Dizziness: Patient returns to office visit today with complaints of dizziness. He is treated for possible pneumonia last week with prednisone and azithromycin. On chest x-ray she had a small possible right middle lobe infiltrate. Patient states she finished her antibiotics on January 16. At that time she was having no more of the sharp chest pains she had experienced prior, however she still endorses a "heaviness in her chest "which she feels is getting worse. January 17th she experienced bad menstrual cramping and a heavy menstrual period. She states this is common for her, she normally takes Aleve a few days prior to her menses and through her menses. She did not do this this time around secondary to being on prednisone. Patient states the heaviness in her chest or then, but she didn't think much of it because she, leg and achiness or heavy feeling in her chest with her menstrual cycles. Patient states she took Aleve yesterday and has made her symptoms improved. However last night when she stood up from a seated position on the couch and became dizzy. She states she does not recall sitting back down on the couch, but found herself in a seated position. She states the room was spinning for a few seconds. She states she then walked into the restroom and while sitting on the toilet she felt like the room was spinning, and increased in spinning occurred when moving her head. She states she looked this up on the Internet, and tried to move her head from side-to-side to see if either side made it worse. She states with movement of her head and either left or right direction, the room would start spinning. After this attempt at moving her head, she became nauseous and experienced vomiting 4. She denies fever, chills, headache, cough, any  respiratory symptoms. She does endorse being fatigued. She denies any chest pain, shortness of breath or syncope.  Past Medical History  Diagnosis Date  . Depression   . Anxiety   . Allergy   . Anemia   . Urinary incontinence     pt states with excercise only.    No Known Allergies  Past Surgical History  Procedure Laterality Date  . Wisdom tooth extraction      Review of Systems Negative, with the exception of above mentioned in HPI     Objective:   Physical Exam BP 116/83 mmHg  Pulse 96  Temp(Src) 98.1 F (36.7 C) (Oral)  Resp 20  Wt 154 lb 12.8 oz (70.217 kg)  SpO2 98%  LMP 02/03/2015 Gen: Afebrile. No acute distress. Nontoxic in appearance, well-developed, well-nourished, Caucasian female. Comfortable. Appears fatigued/pale. HENT: AT. [place]. Puffiness below bilateral eyes. Bilateral TM visualized, as are fluid filled, no erythema or bulging. MMM. Bilateral nares with mild erythema, mild swelling. Throat without erythema or exudates. No cough on exam. No hoarseness on exam. Tenderness to palpation bilateral maxillary sinuses. Eyes:Pupils Equal Round Reactive to light, Extraocular movements intact,  Conjunctiva without redness, discharge or icterus. Neck/lymp/endocrine: Supple, left anterior cervical lymphadenopathy, no thyromegaly CV: RRR no murmurs, clicks, gallops or rubs. No edema Chest: CTAB, no wheeze or crackles. Normal respiratory effort, good air movement. No cough on exam. No pain with deep inspiration. Neuro: Normal gait. PERLA. EOMi. Alert. Oriented  x3 Psych: Normal affect, dress and demeanor. Normal speech. Normal thought content and judgment.     Assessment & Plan:  Stacy Bush is 45 y.o. female with new onset dizziness with movement and maxillary sinus pain. 1. Acute maxillary sinusitis, recurrence not specified - Flonase/doxycycline, rest, hydrate - doxycycline (VIBRA-TABS) 100 MG tablet; Take 1 tablet (100 mg total) by mouth 2 (two) times daily.  Dispense:  20 tablet; Refill: 0 - fluticasone (FLONASE) 50 MCG/ACT nasal spray; Place 2 sprays into both nostrils daily.  Dispense: 16 g; Refill: 0 - Obtain chest x-ray in 2 weeks, to monitor resolution of patchy infiltrate.  2. BPPV (benign paroxysmal positional vertigo), bilateral - Scheduled BPPV, AVS on educational BPPV and sinusitis. - If dizziness continues, patient is encouraged to follow-up sooner. -  If Her symptoms remain would obtain EKG as well considering she continues to complain of "chest heaviness ". No other cardiac signs, if anything obtain EKG for reassurance. - Consider Antivert if needed. Consider vestibular rehabilitation needed. - Dizziness may be secondary to dehydration with recent illness and heavy menses occurring within the same week. - obtain CBC, BMP  Follow-up 2 weeks, sooner if dizziness remains, repeat chest x-ray should be completed prior to appointment.    > 25 minutes spent with patient, >50% of time spent face to face counseling patient and coordinating care.

## 2015-03-04 NOTE — Patient Instructions (Addendum)
Benign Positional Vertigo Vertigo is the feeling that you or your surroundings are moving when they are not. Benign positional vertigo is the most common form of vertigo. The cause of this condition is not serious (is benign). This condition is triggered by certain movements and positions (is positional). This condition can be dangerous if it occurs while you are doing something that could endanger you or others, such as driving.  CAUSES In many cases, the cause of this condition is not known. It may be caused by a disturbance in an area of the inner ear that helps your brain to sense movement and balance. This disturbance can be caused by a viral infection (labyrinthitis), head injury, or repetitive motion. RISK FACTORS This condition is more likely to develop in:  Women.  People who are 50 years of age or older. SYMPTOMS Symptoms of this condition usually happen when you move your head or your eyes in different directions. Symptoms may start suddenly, and they usually last for less than a minute. Symptoms may include:  Loss of balance and falling.  Feeling like you are spinning or moving.  Feeling like your surroundings are spinning or moving.  Nausea and vomiting.  Blurred vision.  Dizziness.  Involuntary eye movement (nystagmus). Symptoms can be mild and cause only slight annoyance, or they can be severe and interfere with daily life. Episodes of benign positional vertigo may return (recur) over time, and they may be triggered by certain movements. Symptoms may improve over time. DIAGNOSIS This condition is usually diagnosed by medical history and a physical exam of the head, neck, and ears. You may be referred to a health care provider who specializes in ear, nose, and throat (ENT) problems (otolaryngologist) or a provider who specializes in disorders of the nervous system (neurologist). You may have additional testing, including:  MRI.  A CT scan.  Eye movement tests. Your  health care provider may ask you to change positions quickly while he or she watches you for symptoms of benign positional vertigo, such as nystagmus. Eye movement may be tested with an electronystagmogram (ENG), caloric stimulation, the Dix-Hallpike test, or the roll test.  An electroencephalogram (EEG). This records electrical activity in your brain.  Hearing tests. TREATMENT Usually, your health care provider will treat this by moving your head in specific positions to adjust your inner ear back to normal. Surgery may be needed in severe cases, but this is rare. In some cases, benign positional vertigo may resolve on its own in 2-4 weeks. HOME CARE INSTRUCTIONS Safety  Move slowly.Avoid sudden body or head movements.  Avoid driving.  Avoid operating heavy machinery.  Avoid doing any tasks that would be dangerous to you or others if a vertigo episode would occur.  If you have trouble walking or keeping your balance, try using a cane for stability. If you feel dizzy or unstable, sit down right away.  Return to your normal activities as told by your health care provider. Ask your health care provider what activities are safe for you. General Instructions  Take over-the-counter and prescription medicines only as told by your health care provider.  Avoid certain positions or movements as told by your health care provider.  Drink enough fluid to keep your urine clear or pale yellow.  Keep all follow-up visits as told by your health care provider. This is important. SEEK MEDICAL CARE IF:  You have a fever.  Your condition gets worse or you develop new symptoms.  Your family or friends   notice any behavioral changes.  Your nausea or vomiting gets worse.  You have numbness or a "pins and needles" sensation. SEEK IMMEDIATE MEDICAL CARE IF:  You have difficulty speaking or moving.  You are always dizzy.  You faint.  You develop severe headaches.  You have weakness in your  legs or arms.  You have changes in your hearing or vision.  You develop a stiff neck.  You develop sensitivity to light.   This information is not intended to replace advice given to you by your health care provider. Make sure you discuss any questions you have with your health care provider.   Document Released: 10/31/2005 Document Revised: 10/14/2014 Document Reviewed: 05/18/2014 Elsevier Interactive Patient Education 2016 [employer] Inc[employer]   Sinusitis, Adult Sinusitis is redness, soreness, and inflammation of the paranasal sinuses. Paranasal sinuses are air pockets within the bones of your face. They are located beneath your eyes, in the middle of your forehead, and above your eyes. In healthy paranasal sinuses, mucus is able to drain out, and air is able to circulate through them by way of your nose. However, when your paranasal sinuses are inflamed, mucus and air can become trapped. This can allow bacteria and other germs to grow and cause infection. Sinusitis can develop quickly and last only a short time (acute) or continue over a long period (chronic). Sinusitis that lasts for more than 12 weeks is considered chronic. CAUSES Causes of sinusitis include:  Allergies.  Structural abnormalities, such as displacement of the cartilage that separates your nostrils (deviated septum), which can decrease the air flow through your nose and sinuses and affect sinus drainage.  Functional abnormalities, such as when the small hairs (cilia) that line your sinuses and help remove mucus do not work properly or are not present. SIGNS AND SYMPTOMS Symptoms of acute and chronic sinusitis are the same. The primary symptoms are pain and pressure around the affected sinuses. Other symptoms include:  Upper toothache.  Earache.  Headache.  Bad breath.  Decreased sense of smell and taste.  A cough, which worsens when you are lying flat.  Fatigue.  Fever.  Thick drainage from your nose, which  often is green and may contain pus (purulent).  Swelling and warmth over the affected sinuses. DIAGNOSIS Your health care provider will perform a physical exam. During your exam, your health care provider may perform any of the following to help determine if you have acute sinusitis or chronic sinusitis:  Look in your nose for signs of abnormal growths in your nostrils (nasal polyps).  Tap over the affected sinus to check for signs of infection.  View the inside of your sinuses using an imaging device that has a light attached (endoscope). If your health care provider suspects that you have chronic sinusitis, one or more of the following tests may be recommended:  Allergy tests.  Nasal culture. A sample of mucus is taken from your nose, sent to a lab, and screened for bacteria.  Nasal cytology. A sample of mucus is taken from your nose and examined by your health care provider to determine if your sinusitis is related to an allergy. TREATMENT Most cases of acute sinusitis are related to a viral infection and will resolve on their own within 10 days. Sometimes, medicines are prescribed to help relieve symptoms of both acute and chronic sinusitis. These may include pain medicines, decongestants, nasal steroid sprays, or saline sprays. However, for sinusitis related to a bacterial infection, your health care provider will prescribe  antibiotic medicines. These are medicines that will help kill the bacteria causing the infection. Rarely, sinusitis is caused by a fungal infection. In these cases, your health care provider will prescribe antifungal medicine. For some cases of chronic sinusitis, surgery is needed. Generally, these are cases in which sinusitis recurs more than 3 times per year, despite other treatments. HOME CARE INSTRUCTIONS  Drink plenty of water. Water helps thin the mucus so your sinuses can drain more easily.  Use a humidifier.  Inhale steam 3-4 times a day (for example, sit  in the bathroom with the shower running).  Apply a warm, moist washcloth to your face 3-4 times a day, or as directed by your health care provider.  Use saline nasal sprays to help moisten and clean your sinuses.  Take medicines only as directed by your health care provider.  If you were prescribed either an antibiotic or antifungal medicine, finish it all even if you start to feel better. SEEK IMMEDIATE MEDICAL CARE IF:  You have increasing pain or severe headaches.  You have nausea, vomiting, or drowsiness.  You have swelling around your face.  You have vision problems.  You have a stiff neck.  You have difficulty breathing.   This information is not intended to replace advice given to you by your health care provider. Make sure you discuss any questions you have with your health care provider.   Document Released: 01/23/2005 Document Revised: 02/13/2014 Document Reviewed: 02/07/2011 Elsevier Interactive Patient Education [employer]  I have called in doxycyline and flonase.  Make certain to maintain good hydration and rest.

## 2015-03-04 NOTE — Telephone Encounter (Signed)
Patient scheduled.

## 2015-03-05 ENCOUNTER — Other Ambulatory Visit (INDEPENDENT_AMBULATORY_CARE_PROVIDER_SITE_OTHER): Payer: BC Managed Care – PPO

## 2015-03-05 DIAGNOSIS — R5383 Other fatigue: Secondary | ICD-10-CM

## 2015-03-05 DIAGNOSIS — R42 Dizziness and giddiness: Secondary | ICD-10-CM

## 2015-03-05 LAB — CBC WITH DIFFERENTIAL/PLATELET
BASOS ABS: 0 10*3/uL (ref 0.0–0.1)
Basophils Relative: 0.5 % (ref 0.0–3.0)
EOS PCT: 1.4 % (ref 0.0–5.0)
Eosinophils Absolute: 0.1 10*3/uL (ref 0.0–0.7)
HEMATOCRIT: 44.5 % (ref 36.0–46.0)
Hemoglobin: 14.5 g/dL (ref 12.0–15.0)
LYMPHS PCT: 32.6 % (ref 12.0–46.0)
Lymphs Abs: 2.3 10*3/uL (ref 0.7–4.0)
MCHC: 32.5 g/dL (ref 30.0–36.0)
MCV: 93.2 fl (ref 78.0–100.0)
MONOS PCT: 7.5 % (ref 3.0–12.0)
Monocytes Absolute: 0.5 10*3/uL (ref 0.1–1.0)
NEUTROS ABS: 4.1 10*3/uL (ref 1.4–7.7)
Neutrophils Relative %: 58 % (ref 43.0–77.0)
Platelets: 302 10*3/uL (ref 150.0–400.0)
RBC: 4.78 Mil/uL (ref 3.87–5.11)
RDW: 12.9 % (ref 11.5–15.5)
WBC: 7 10*3/uL (ref 4.0–10.5)

## 2015-03-05 LAB — COMPREHENSIVE METABOLIC PANEL
ALBUMIN: 4.4 g/dL (ref 3.5–5.2)
ALK PHOS: 28 U/L — AB (ref 39–117)
ALT: 8 U/L (ref 0–35)
AST: 12 U/L (ref 0–37)
BILIRUBIN TOTAL: 0.4 mg/dL (ref 0.2–1.2)
BUN: 10 mg/dL (ref 6–23)
CALCIUM: 9.7 mg/dL (ref 8.4–10.5)
CO2: 32 mEq/L (ref 19–32)
Chloride: 102 mEq/L (ref 96–112)
Creatinine, Ser: 0.67 mg/dL (ref 0.40–1.20)
GFR: 102.41 mL/min (ref >60.00)
Glucose, Bld: 59 mg/dL — ABNORMAL LOW (ref 70–99)
Potassium: 4.9 mEq/L (ref 3.5–5.1)
Sodium: 137 mEq/L (ref 135–145)
TOTAL PROTEIN: 7.2 g/dL (ref 6.0–8.3)

## 2015-03-08 ENCOUNTER — Telehealth: Payer: Self-pay | Admitting: Family Medicine

## 2015-03-08 NOTE — Telephone Encounter (Signed)
Please call pt: - her labs resulted with lower end blood sugars and a lower alk phos. - Pt should be encouraged to eat 3 meals a day, with a small snack. This could have been causing her dizziness.  - Lower end alk phos can be from low zinc. I would encourage her to start and OTC supplement.  - if she is still having dizziness, I want to see her sooner, than we discussed. Otherwise we will follow up in the 2 ish weeks we discussed and review labs in detail at that time.

## 2015-03-09 NOTE — Telephone Encounter (Signed)
Left message for patient to return call to review labs and instructions. 

## 2015-03-09 NOTE — Telephone Encounter (Signed)
Patient aware of results.  Pt states that she hasn't had any dizziness since.

## 2015-03-11 ENCOUNTER — Encounter: Payer: Self-pay | Admitting: Family Medicine

## 2015-03-11 ENCOUNTER — Other Ambulatory Visit: Payer: Self-pay | Admitting: *Deleted

## 2015-03-11 DIAGNOSIS — E559 Vitamin D deficiency, unspecified: Secondary | ICD-10-CM

## 2015-03-15 ENCOUNTER — Ambulatory Visit (HOSPITAL_BASED_OUTPATIENT_CLINIC_OR_DEPARTMENT_OTHER)
Admission: RE | Admit: 2015-03-15 | Discharge: 2015-03-15 | Disposition: A | Discharge status: Home / Self Care | Payer: BC Managed Care – PPO | Source: Ambulatory Visit | Attending: Family Medicine | Admitting: Family Medicine

## 2015-03-15 DIAGNOSIS — R079 Chest pain, unspecified: Secondary | ICD-10-CM | POA: Diagnosis not present

## 2015-03-15 DIAGNOSIS — R9389 Abnormal findings on diagnostic imaging of other specified body structures: Secondary | ICD-10-CM

## 2015-03-15 DIAGNOSIS — R938 Abnormal findings on diagnostic imaging of other specified body structures: Secondary | ICD-10-CM | POA: Insufficient documentation

## 2015-03-16 ENCOUNTER — Telehealth: Payer: Self-pay | Admitting: *Deleted

## 2015-03-16 NOTE — Telephone Encounter (Signed)
As discussed prior, patient is to follow-up to discuss all studies in follow-up on her progression. -  Briefly , her chest x-ray is normal.

## 2015-03-16 NOTE — Telephone Encounter (Signed)
Patient called left message requesting chest xray results.

## 2015-03-16 NOTE — Telephone Encounter (Signed)
Spoke with patient reviewed test xray results and instructions.

## 2015-03-17 ENCOUNTER — Other Ambulatory Visit: Payer: BC Managed Care – PPO

## 2015-03-17 ENCOUNTER — Ambulatory Visit (INDEPENDENT_AMBULATORY_CARE_PROVIDER_SITE_OTHER): Payer: BC Managed Care – PPO | Admitting: Family Medicine

## 2015-03-17 ENCOUNTER — Encounter: Payer: Self-pay | Admitting: Family Medicine

## 2015-03-17 VITALS — BP 106/74 | HR 91 | Temp 98.2°F | Resp 20 | Wt 158.5 lb

## 2015-03-17 DIAGNOSIS — R002 Palpitations: Secondary | ICD-10-CM | POA: Diagnosis not present

## 2015-03-17 DIAGNOSIS — E559 Vitamin D deficiency, unspecified: Secondary | ICD-10-CM

## 2015-03-17 DIAGNOSIS — R0781 Pleurodynia: Secondary | ICD-10-CM | POA: Diagnosis not present

## 2015-03-17 DIAGNOSIS — R079 Chest pain, unspecified: Secondary | ICD-10-CM | POA: Diagnosis not present

## 2015-03-17 LAB — IBC PANEL
Iron: 119 ug/dL (ref 42–145)
SATURATION RATIOS: 29.5 % (ref 20.0–50.0)
Transferrin: 288 mg/dL (ref 212.0–360.0)

## 2015-03-17 LAB — IRON AND TIBC
%SAT: 32 % (ref 11–50)
Iron: 116 ug/dL (ref 40–190)
TIBC: 361 ug/dL (ref 250–450)
UIBC: 245 ug/dL (ref 125–400)

## 2015-03-17 LAB — CORTISOL: CORTISOL PLASMA: 6.3 ug/dL

## 2015-03-17 LAB — FERRITIN: FERRITIN: 13.9 ng/mL (ref 10.0–291.0)

## 2015-03-17 LAB — VITAMIN D 25 HYDROXY (VIT D DEFICIENCY, FRACTURES): VITD: 41.34 ng/mL (ref 30.00–100.00)

## 2015-03-17 LAB — HEMOGLOBIN A1C: HEMOGLOBIN A1C: 5.7 % (ref 4.6–6.5)

## 2015-03-17 LAB — TROPONIN I: TNIDX: 0 ug/l (ref 0.00–0.06)

## 2015-03-17 MED ORDER — DIAZEPAM 5 MG PO TABS: 5.0000 mg | ORAL_TABLET | Freq: Two times a day (BID) | ORAL | Status: DC | PRN

## 2015-03-17 MED ORDER — BACLOFEN 10 MG PO TABS: 10.0000 mg | ORAL_TABLET | Freq: Three times a day (TID) | ORAL | Status: DC

## 2015-03-17 NOTE — Patient Instructions (Signed)
I have called in valium and baclofen for you to try.  Take aleve every 12 hours, follow with gyn. Cardiology will be getting ahold of you to schedule evaluation.  I will call you with results once available.

## 2015-03-17 NOTE — Progress Notes (Signed)
Patient ID: Stacy Bush, female   DOB: May 07, 1970, 45 y.o.   MRN: [id number]   Subjective:    Patient ID: Stacy Bush, female    DOB: 08/31/1970, 45 y.o.   MRN: [id number]  HPI   Chest discomfort/pleuritic pain: Patient returns for follow-up today to discuss her lab work and x-rays as well as follow-up on her pleuritic chest pain and palpitations. Patient states the pain remains intermittently, mostly left-sided but occurs bilateral lower anterior lung fields. She states she went to her chiropractor on Friday, and after treatment "felt great ". She states then the next day the pain came back with a "vengeance "and is more sharp in nature. She states that her palpitations are worse with laying on her left side. She reports she's having occasional sweats, chills and stabbing chest pain. She is wondering if her menstrual cycle could be playing a role in the return of her symptoms. She states last month this time she had an increase in the pleuritic pain and it was between ovulation and her menstrual cycle. She states she has had long-standing pelvic "pinching pain" that started in her lower pelvic and I reviewed the last 2 years have risen into her chest. She is uncertain if she's ever had history of endometriosis, but she doesn't think so.  She states she has done research online and she feels this is more of a pleuritic catch but doesn't know why she is getting it frequently because the Internet states that it's anxiety driven. Patient denies fever, night sweats, unintentional weight loss. She has had no more episodes of dizziness has her prior appointment. Of note her prior labs did result with an episode of hypoglycemia. Her repeat chest x-ray, after pneumonia treatment, was clear yesterday. This was reviewed with patient today.     Initial prior office visit 02/22/2015. Left breast/chest pain: Patient presents for an acute office visit with left breast pain of 3 weeks in duration. Patient has never  experienced pain like this in the past. SHe states it would happen a couple times a day, and now is up to 6 times a day. She reports it feels like a "stitch" when experiences when running in their side. SHe states the pain lasts only a couple seconds and at the most 1 minute. She denies any radiation of pain, diaphoresis, nausea, vomit, shortness of breath, fever, recent URI or dizziness. Patient does endorse a more sedentary lifestyle over the last month. She denies any fever or palpitations. She denies history of arrhythmia. She states she's had many EKGs, for a "pounding heart "they have all been normal. She has no history of DVTs. She states the pain is worse with movement, especially bending and twisting. She describes the pain as a stabbing, twinge-like pain in her left breast/chest wall. She states that she feels it coming on she can take a fairly deep breath and stretch out her chest wall and she can prevent the pain from happening  Past Medical History  Diagnosis Date  . Depression   . Anxiety   . Allergy   . Anemia   . Urinary incontinence     pt states with excercise only.    No Known Allergies  Past Surgical History  Procedure Laterality Date  . Wisdom tooth extraction     Social History   Social History  . Marital Status: Married    Spouse Name: N/A  . Number of Children: N/A  . Years of Education: N/A  Occupational History  . Not on file.   Social History Main Topics  . Smoking status: Never Smoker   . Smokeless tobacco: Never Used  . Alcohol Use: No  . Drug Use: No  . Sexual Activity: Yes     Comment: husband vasectomy   Other Topics Concern  . Not on file   Social History Narrative   Married. Spouse's name is Optometrist. They have 1 child, named Armed forces technical officer .    Museum/gallery exhibitions officer. Master's Degree.   Patient drinks caffeine, uses herbal remedies, takes a daily vitamin.   Patient wears her seatbelt, she wears a bike helmet. She exercises at least 3 times a week.    Patient does report a vegetarian diet.   There is a smoke detector in her home, she feels safe in her relationships.     Review of Systems Negative, with the exception of above mentioned in HPI     Objective:   Physical Exam BP 106/74 mmHg  Pulse 91  Temp(Src) 98.2 F (36.8 C) (Oral)  Resp 20  Wt 158 lb 8 oz (71.895 kg)  SpO2 100%  LMP 03/03/2015 Gen: Afebrile. No acute distress. Nontoxic in appearance, well-developed, well-nourished, Caucasian female. Comfortable. Appears fatigued/pale. HENT: AT. [place].  MMM. No cough on exam. No hoarseness on exam.  Eyes:Pupils Equal Round Reactive to light, Extraocular movements intact,  Conjunctiva without redness, discharge or icterus. CV: RRR no murmurs, clicks, gallops or rubs. No edema Chest: CTAB, no wheeze or crackles. Normal respiratory effort, good air movement. No cough on exam.  Neuro: Normal gait. PERLA. EOMi. Alert. Oriented x3 Psych: Anxious. Normal speech. Normal thought content and judgment.  EKG: Sinus rhythm. Heart rate 82, PR 132, QTC 404. QRSD: 102. No ST changes. No prior EKG to compare.    Assessment & Plan:  Stacy Bush is 45 y.o. female with follow-up on chest discomfort and pneumonia. Patient appropriately treated for pneumonia, with resolution on chest x-ray. Patient reassured her pneumonia has been resolved. She continues to complain of pleuritic chest pain and increase in palpitations. - EKG today with sinus rhythm. No arrhythmia. No PVCs. No ST changes. - Patient is anxious, continues with symptoms. We'll collect iron, A1c for hypoglycemic event, troponins for reassurance, cortisol level (? AI)  - Referred to cardiology for continued workup on palpitations and atypical chest pain, patient was advised she likely will have a Holter monitor to attempt to try to capture events. - We'll attempt trial of muscle relaxer and Valium. Patient was encouraged to take baclofen throughout the day if able to tolerate and use Valium at  night. If she is able to tolerate valium without sedation, she may use through the day if needed (BID PRN). Hopefully this will be able to allow [place] to differentiate between anxiety/musculoskeletal and cardiac pain. - Follow-up dependent upon lab results, and cardiology eval  > 25 minutes spent with patient, >50% of time spent face to face counseling patient and coordinating care.

## 2015-03-18 ENCOUNTER — Telehealth: Payer: Self-pay | Admitting: Family Medicine

## 2015-03-18 LAB — PTH, INTACT AND CALCIUM
Calcium: 9.5 mg/dL (ref 8.4–10.5)
PTH: 23 pg/mL (ref 14–64)

## 2015-03-18 NOTE — Telephone Encounter (Signed)
Please call patient, all lab results are normal.

## 2015-03-18 NOTE — Telephone Encounter (Signed)
Left message with results on patient voice mail 

## 2015-03-19 ENCOUNTER — Ambulatory Visit: Payer: BC Managed Care – PPO | Admitting: Family Medicine

## 2015-04-08 ENCOUNTER — Ambulatory Visit: Payer: BC Managed Care – PPO | Admitting: Cardiovascular Disease

## 2015-10-08 ENCOUNTER — Ambulatory Visit (INDEPENDENT_AMBULATORY_CARE_PROVIDER_SITE_OTHER): Payer: BC Managed Care – PPO | Admitting: Family Medicine

## 2015-10-08 ENCOUNTER — Encounter: Payer: Self-pay | Admitting: Family Medicine

## 2015-10-08 VITALS — BP 111/74 | HR 86 | Temp 98.5°F | Resp 20 | Wt 154.8 lb

## 2015-10-08 DIAGNOSIS — M542 Cervicalgia: Secondary | ICD-10-CM | POA: Diagnosis not present
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
## 2015-10-14 ENCOUNTER — Encounter: Payer: Self-pay | Admitting: Family Medicine

## 2015-10-27 ENCOUNTER — Ambulatory Visit: Payer: BC Managed Care – PPO | Attending: Family Medicine

## 2015-10-27 DIAGNOSIS — R252 Cramp and spasm: Secondary | ICD-10-CM | POA: Insufficient documentation

## 2015-10-27 DIAGNOSIS — R293 Abnormal posture: Secondary | ICD-10-CM | POA: Insufficient documentation

## 2015-10-27 DIAGNOSIS — M542 Cervicalgia: Secondary | ICD-10-CM | POA: Diagnosis present

## 2015-10-27 DIAGNOSIS — M25512 Pain in left shoulder: Secondary | ICD-10-CM | POA: Diagnosis present

## 2015-10-27 DIAGNOSIS — M25552 Pain in left hip: Secondary | ICD-10-CM | POA: Insufficient documentation

## 2015-10-27 DIAGNOSIS — M25551 Pain in right hip: Secondary | ICD-10-CM | POA: Insufficient documentation

## 2015-10-27 NOTE — Patient Instructions (Addendum)
Angry Cat Stretch  Tuck chin and tighten stomach, arching back. Repeat __5-10__ times per set.  Do _1-2___ sessions per day. Child Pose   Sitting on knees, fold body over legs and relax head and arms on floor. Hold for _20___ breaths.  Do 3 reps.  1-2 times a day.  Copyright  VHI. All rights reserved.  Side Waist Stretch from Child's Pose  From child's pose, walk hands to left. Reach right hand out on diagonal. Reach hips back toward heels making a C with torso. Breathe into right side waist. Hold for _20___ breaths. Repeat _3___ times each side.  1-2 times a day.  Copyright  VHI. All rights reserved.     Hip flexor stretch: Choose from one below  Hold 20 seconds and do 3 each  Hip Flexor Stretch   Lying on back near edge of bed, bend one leg, foot flat. Hang other leg over edge, relaxed, thigh resting entirely on bed for ____ minutes. Repeat ____ times. Do ____ sessions per day. Advanced Exercise: Bend knee back keeping thigh in contact with bed.  http://[email or web address].exer.us/346   HIP: Flexors - Supine   Lie on edge of surface. Place leg off the surface, allow knee to bend. Bring other knee toward chest. Hold ___ seconds. ___ reps per set, ___ sets per day, ___ days per week Rest lowered foot on stool.  BACK: Hip Flexor Stretch   Interlace fingers on top of right knee. Shift weight forward. Continue breathing normally and hold position for ___ breaths. Repeat on other leg. Alternate sides ___ times. Do ___ times per day.  Quads / HF, Standing   Stand, holding onto chair and grasping one foot with other-side hand. Pull heel toward buttock until stretch is felt in front of thigh. Hold ___ seconds.  Repeat ___ times per session. Do ___ sessions per day.  Copyright  VHI. All rights reserved.   HIP: Hamstrings - Short Sitting    Rest leg on raised surface. Keep knee straight. Lift chest. Hold __20_ seconds. __3_ reps per set, _3__ sets per day  Copyright  VHI. All  rights reserved.   PERFORM ALL EXERCISES GENTLY AND WITH GOOD POSTURE.    20 SECOND HOLD, 3 REPS TO EACH SIDE. 4-5 TIMES EACH DAY.   AROM: Neck Rotation   Turn head slowly to look over one shoulder, then the other.   AROM: Neck Flexion   Bend head forward.   AROM: Lateral Neck Flexion   Slowly tilt head toward one shoulder, then the other.   [facility] [address], [address] [place], [place] [address] Phone # [phone] Fax [phone]

## 2015-10-27 NOTE — Therapy (Signed)
[facility] [address] [place], [place], [address] Phone: [phone]   Fax:  [phone]  Physical Therapy Evaluation  Patient Details  Name: Stacy Bush MRN: [id number] Date of Birth: May 15, 1970 Referring Provider: Felix Pacini, MD  Encounter Date: 10/27/2015      PT End of Session - 10/27/15 1110    Visit Number 1   Date for PT Re-Evaluation 12/22/15   PT Start Time 1017   PT Stop Time 1104   PT Time Calculation (min) 47 min   Activity Tolerance Patient tolerated treatment well   Behavior During Therapy [facility] for tasks assessed/performed      Past Medical History:  Diagnosis Date  . Allergy   . Anemia   . Anxiety   . Depression   . Urinary incontinence    pt states with excercise only.     Past Surgical History:  Procedure Laterality Date  . WISDOM TOOTH EXTRACTION      There were no vitals filed for this visit.       Subjective Assessment - 10/27/15 1023    Subjective Pt presents to PT with chronic neck stiffness and onset of pain in the neck and Lt shoulder/chest pain that began 03/2015 without cause.  Pt has been seeing a chiropractor for this.  Pt also with complaints of lateral thigh pain mostly when desceding and incline when hiking.     Diagnostic tests x-ray: reduced cervical and thoracic curvature   Patient Stated Goals reduce neck pain, reduce Lt shoulder pain, reduce tension, hike without pain   Currently in Pain? Yes   Pain Score 3    Pain Location Neck   Pain Orientation Left;Right  Lt>Rt   Pain Descriptors / Indicators Tightness;Spasm   Pain Type Chronic pain   Pain Radiating Towards Lt chest and shoulder   Pain Onset More than a month ago   Pain Frequency Intermittent   Aggravating Factors  driving, turning head with driving, sitting at work, stress   Pain Relieving Factors change position, theracane, stretching, heat   Multiple Pain Sites Yes   Pain Score --  0/10 today.   Upt to 7/10 when hiking   Pain Location Leg   Pain Orientation Right;Left;Lateral   Pain Descriptors / Indicators Sharp;Tightness   Pain Type Chronic pain   Pain Onset More than a month ago   Pain Frequency Intermittent   Aggravating Factors  only with descending hills when hiking   Pain Relieving Factors when not hiking            [facility] PT Assessment - 10/27/15 0001      Assessment   Medical Diagnosis neck pain, Lt shoulder pain, IT band syndrome   Referring Provider Felix Pacini, MD   Onset Date/Surgical Date 03/29/15   Prior Therapy chiropractor (current) for neck and thoracic pain     Precautions   Precautions None     Restrictions   Weight Bearing Restrictions No     Balance Screen   Has the patient fallen in the past 6 months No   Has the patient had a decrease in activity level because of a fear of falling?  No   Is the patient reluctant to leave their home because of a fear of falling?  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  Bil hip AROM is full without pain.  Stiffness reported at end range of each.       PROM   Overall PROM  Within functional limits for tasks  performed     Strength   Overall Strength Within functional limits for tasks performed   Overall Strength Comments 4+5 to 5/5 UE and LE strength.     Palpation   Spinal mobility reduced PA mobility in the thoracic spine without pain.  Cervical mobility is normal.    Palpation comment Pt with active trigger points in bil upper traps, cervical paraspinals and bil. rhomboids.  Tension at distal ITB with intermittent trigger points.     Ambulation/Gait   Ambulation/Gait Yes   Ambulation Distance (Feet) 100 Feet   Gait Pattern Within Functional Limits                           PT Education - 10/27/15 1056    Education provided Yes   Education Details hip flexor stretch, hamstring stretch, thoracic mobility, cervical flexibility   Person(s) Educated Patient   Methods Explanation;Demonstration;Handout   Comprehension Verbalized understanding;Returned demonstration          PT Short Term Goals - 10/27/15 1115      PT SHORT TERM GOAL #1   Title be independent in initial HEP   Time 4   Period Weeks   Status New     PT SHORT TERM GOAL #2   Title report a 30% reduction in neck/thoracic pain with sitting at work   Time 4   Period Weeks   Status New     PT SHORT TERM GOAL #3   Title report < or = to 5/10 leg pain with descending inclines when hiking   Time 4   Period Weeks   Status New           PT Long Term Goals - 10/27/15 1009      PT LONG TERM GOAL #1   Title be independent in advanced HEP   Time 8   Period Weeks   Status New     PT LONG TERM GOAL #2   Title reduce FOTO to < or = to 25% limitation    Time 8   Period Weeks   Status New     PT LONG TERM GOAL #3   Title report a 60% reduction in neck/thoracic pain with sitting at work   Time 8   Period Weeks   Status New     PT LONG TERM GOAL #4   Title report < or = to 3/10 bil. leg pain with descending inclines when hiking   Time 8   Period Weeks   Status  New                Plan - 10/27/15 1110    Clinical Impression Statement Pt presents to PT with compliaints of neck pain and Lt UE/chest pain/tension that began 03/2015 without cause.  Pt also with bil lateral thigh pain only when hiking.  Pt reports consistent pain and tension in the neck and thoracic spine expecially with sitting at work.  Pt rates bil. leg pain as 7/10 when hiking.  Pt has been seeing a chiropractor for neck and thoracic spine for adjutements, Graston technique and modalities.  Pt with active trigger points in the scapular region bilaterally and will benefit from dry needling.  Pt with good postural awareness and demonstrates reduced thoracic curvature and reduced thoracic mobility.  Pt will benefit from skilled PT for dry needling to bil. IT band, upper traps and rhomboids in addition to hip flexibility, postural strength and thoracic mobility.     Rehab Potential Good   PT Frequency 2x / week   PT Duration 8 weeks   PT Treatment/Interventions ADLs/Self Care Home Management;Cryotherapy;Electrical Stimulation;Functional mobility training;Ultrasound;Moist Heat;Therapeutic activities;Therapeutic exercise;Neuromuscular re-education;Patient/family education;Passive range of motion;Manual techniques;Dry needling;Taping   PT Next Visit Plan Dry needling to neck/rhomboids/upper trap/IT band, review HEP as needed, postural strength, seated thoracic mobility stretch   Consulted and Agree with Plan of Care Patient      Patient will benefit from skilled therapeutic intervention in order to improve the following deficits and impairments:  Decreased range of motion, Pain, Postural dysfunction, Decreased activity tolerance, Increased muscle spasms  Visit Diagnosis: Cervicalgia  Pain in left shoulder  Pain in left hip  Pain in right hip  Cramp and spasm  Abnormal posture     Problem List Patient Active Problem List   Diagnosis Date Noted  . Maxillary sinusitis 03/04/2015  . BPPV (benign  paroxysmal positional vertigo) 03/04/2015  . Dizziness 03/04/2015  . Pleuritic chest pain 02/22/2015  . Constipation 12/18/2014  . Palpitations 12/18/2014  . Encounter to establish care 12/18/2014  . Health care maintenance 12/18/2014  . Vegetarian diet 12/18/2014  . Abnormal TSH 12/18/2014     Lorrene Reid, PT 10/27/15 11:18 AM  [facility] [address], [address] [place], [place], [address] Phone: [phone]   Fax:  [phone]  Name: SHAUNNA ROSETTI MRN: [id number] Date of Birth: 11/07/1970

## 2015-10-27 NOTE — Addendum Note (Signed)
Addended by: Edrick OhAKACS, KELLY M on: 10/27/2015 11:22 AM   Modules accepted: Orders

## 2015-11-05 ENCOUNTER — Encounter: Payer: BC Managed Care – PPO | Admitting: Physical Therapy

## 2015-11-15 ENCOUNTER — Ambulatory Visit: Payer: BC Managed Care – PPO | Attending: Family Medicine

## 2015-11-15 DIAGNOSIS — M25551 Pain in right hip: Secondary | ICD-10-CM

## 2015-11-15 DIAGNOSIS — M25552 Pain in left hip: Secondary | ICD-10-CM

## 2015-11-15 DIAGNOSIS — R252 Cramp and spasm: Secondary | ICD-10-CM | POA: Diagnosis present

## 2015-11-15 DIAGNOSIS — M542 Cervicalgia: Secondary | ICD-10-CM | POA: Diagnosis not present

## 2015-11-15 DIAGNOSIS — G8929 Other chronic pain: Secondary | ICD-10-CM | POA: Diagnosis present

## 2015-11-15 DIAGNOSIS — R293 Abnormal posture: Secondary | ICD-10-CM | POA: Diagnosis present

## 2015-11-15 DIAGNOSIS — M25512 Pain in left shoulder: Secondary | ICD-10-CM | POA: Diagnosis present

## 2015-11-15 NOTE — Patient Instructions (Addendum)

## 2015-11-15 NOTE — Therapy (Signed)
[facility] [address], [address] [place], [place], [address] Phone: [phone]   Fax:  [phone]  Physical Therapy Treatment  Patient Details  Name: Stacy Bush MRN: [id number] Date of Birth: 11-28-1970 Referring Provider: Felix Pacini, MD  Encounter Date: 11/15/2015      PT End of Session - 11/15/15 1702    Visit Number 2   Date for PT Re-Evaluation 12/22/15   PT Start Time 1615  dry needling   PT Stop Time 1656   PT Time Calculation (min) 41 min   Activity Tolerance Patient tolerated treatment well   Behavior During Therapy [facility] for tasks assessed/performed      Past Medical History:  Diagnosis Date  . Allergy   . Anemia   . Anxiety   . Depression   . Urinary incontinence    pt states with excercise only.     Past Surgical History:  Procedure Laterality Date  . WISDOM TOOTH EXTRACTION      There were no vitals filed for this visit.      Subjective Assessment - 11/15/15 1618    Subjective Pt had a lapse in treatment since 10/27/15.  continues to see chiropractor.  Pt has been doing stretches.   Patient Stated Goals reduce neck pain, reduce Lt shoulder pain, reduce tension, hike without pain   Currently in Pain? Yes   Pain Score 0-No pain   Pain Location Neck   Pain Orientation Right;Left   Pain Descriptors / Indicators Tightness   Pain Type Chronic pain   Pain Onset More than a month ago   Pain Frequency Intermittent   Aggravating Factors  driving, turning head with driving, sitting at work, stress   Pain Relieving Factors change position   Pain Score --  hasnt done the aggravating activity   Pain Location Leg   Pain Orientation Right;Left;Lateral   Pain Descriptors / Indicators Sharp;Tightness   Pain Type Chronic pain   Pain Onset More than a month ago   Pain Frequency Intermittent   Aggravating Factors  only with descending hills when hiking   Pain Relieving Factors when not hiking                          [facility] Adult PT Treatment/Exercise - 11/15/15 0001      Manual Therapy   Manual Therapy Soft tissue mobilization;Myofascial release   Manual therapy comments Soft tissue elongation and trigger point release to Rt>Lt rhomboids and upper trap and Lt quads and IT band          Trigger Point Dry Needling - 11/15/15 1622    Consent Given? Yes   Education Handout Provided Yes  pneumothorax precautions   Muscles Treated Upper Body Upper trapezius;Rhomboids  cervical multifidi   Muscles Treated Lower Body Tensor fascia lata;Quadriceps   Upper Trapezius Response Twitch reponse elicited;Palpable increased muscle length   Rhomboids Response Twitch response elicited;Palpable increased muscle length   Tensor Fascia Lata Response Twitch response elicited;Palpable increased muscle length  Lt   Quadriceps Response Twitch response elicited;Palpable increased muscle length  Lt              PT Education - 11/15/15 1622    Education provided Yes   Education Details DN info   Person(s) Educated Patient   Methods Explanation;Demonstration;Handout   Comprehension Verbalized understanding;Returned demonstration          PT Short Term Goals - 11/15/15 1657  PT SHORT TERM GOAL #1   Title be independent in initial HEP   Status Achieved     PT SHORT TERM GOAL #2   Title report a 30% reduction in neck/thoracic pain with sitting at work   Status Achieved     PT SHORT TERM GOAL #3   Title report < or = to 5/10 leg pain with descending inclines when hiking   Time 4   Period Weeks   Status On-going           PT Long Term Goals - 10/27/15 1009      PT LONG TERM GOAL #1   Title be independent in advanced HEP   Time 8   Period Weeks   Status New     PT LONG TERM GOAL #2   Title reduce FOTO to < or = to 25% limitation    Time 8   Period Weeks   Status New     PT LONG TERM GOAL #3   Title report a 60% reduction in neck/thoracic pain  with sitting at work   Time 8   Period Weeks   Status New     PT LONG TERM GOAL #4   Title report < or = to 3/10 bil. leg pain with descending inclines when hiking   Time 8   Period Weeks   Status New               Plan - 11/15/15 1658    Clinical Impression Statement Pt with only 1 session after evaluation.  Pt with active trigger points in Rt>Lt upper traps, rhomboids and subscapularis in addition to Lt quads and IT band.  Pt demonstrated improved tissue mobility after dry needling today.  Pt reports 60% overall improvement with stretching and manual techniques performed at the chiropractor.  Pt will continue to benefit from skilled PT for dry needling to neck, thoracic spine and Lt LE.     Rehab Potential Good   PT Frequency 2x / week   PT Duration 8 weeks   PT Treatment/Interventions ADLs/Self Care Home Management;Cryotherapy;Electrical Stimulation;Functional mobility training;Ultrasound;Moist Heat;Therapeutic activities;Therapeutic exercise;Neuromuscular re-education;Patient/family education;Passive range of motion;Manual techniques;Dry needling;Taping   PT Next Visit Plan Dry needling to neck/rhomboids/upper trap/IT band, review HEP as needed, postural strength, seated thoracic mobility stretch   Consulted and Agree with Plan of Care Patient      Patient will benefit from skilled therapeutic intervention in order to improve the following deficits and impairments:  Decreased range of motion, Pain, Postural dysfunction, Decreased activity tolerance, Increased muscle spasms  Visit Diagnosis: Cervicalgia  Pain in left hip  Chronic left shoulder pain  Pain in right hip  Cramp and spasm  Abnormal posture     Problem List Patient Active Problem List   Diagnosis Date Noted  . Maxillary sinusitis 03/04/2015  . BPPV (benign paroxysmal positional vertigo) 03/04/2015  . Dizziness 03/04/2015  . Pleuritic chest pain 02/22/2015  . Constipation 12/18/2014  .  Palpitations 12/18/2014  . Encounter to establish care 12/18/2014  . Health care maintenance 12/18/2014  . Vegetarian diet 12/18/2014  . Abnormal TSH 12/18/2014     Lorrene ReidKelly Newton Frutiger, PT 11/15/15 5:04 PM  [facility] [address] [place], [place], [address] Phone: [phone]   Fax:  [phone]  Name: Stacy Bush MRN: [id number] Date of Birth: [birth year]

## 2015-11-23 ENCOUNTER — Ambulatory Visit: Payer: BC Managed Care – PPO | Admitting: Physical Therapy

## 2015-11-23 DIAGNOSIS — M542 Cervicalgia: Secondary | ICD-10-CM

## 2015-11-23 NOTE — Therapy (Addendum)
[facility] [address], [place], [place], [address] Phone: [phone]   Fax:  [phone]  Physical Therapy Treatment  Patient Details  Name: Stacy Bush MRN: [id number] Date of Birth: [birth year] Referring Provider: Howard Pouch, MD  Encounter Date: 11/23/2015      PT End of Session - 11/23/15 2037    Visit Number 3   Date for PT Re-Evaluation 12/22/15   PT Start Time 0845   PT Stop Time 0927   PT Time Calculation (min) 42 min   Activity Tolerance Patient tolerated treatment well      Past Medical History:  Diagnosis Date  . Allergy   . Anemia   . Anxiety   . Depression   . Urinary incontinence    pt states with excercise only.     Past Surgical History:  Procedure Laterality Date  . WISDOM TOOTH EXTRACTION      There were no vitals filed for this visit.      Subjective Assessment - 11/23/15 0849    Subjective Patient states she continues to get Active Release, Graston, manipulation.  Had dry needling and "now in chronic debilitating pain".  Soreness for 2 days then on Wednesday woke up with severe pain and nothing helps (home TENS, Alleve, topical ointment).  Nothing I do helps.  My right upper trap feels like a rock/knot that runs to the base of shoulder blade.     Currently in Pain? Yes   Pain Score 7    Pain Location Neck   Pain Orientation Right   Pain Type Chronic pain                         [facility] Adult PT Treatment/Exercise - 11/23/15 0001      Neck Exercises: Seated   Other Seated Exercise thoracic extension with ball and 1/2 foam wedge 10x each   Other Seated Exercise discussed use of foam roll for supine ex's      Ultrasound   Ultrasound Location right neck and upper trap    Ultrasound Parameters 1.2 w/cms 8 min 1 MHZ   Ultrasound Goals Pain     Manual Therapy   Manual Therapy Soft tissue mobilization;Taping   Soft tissue mobilization gentle soft tissue work  to right upper trap, levator, rhomboid   Kinesiotex Inhibit Muscle     Kinesiotix   Inhibit Muscle  bilateral uppper traps                PT Education - 11/23/15 2035    Education provided Yes   Education Details discussed holding on use of Theracane or aggressive stretching    Person(s) Educated Patient   Methods Explanation;Demonstration;Handout   Comprehension Verbalized understanding;Returned demonstration          PT Short Term Goals - 11/23/15 2045      PT SHORT TERM GOAL #1   Title be independent in initial HEP   Status Achieved     PT SHORT TERM GOAL #2   Title report a 30% reduction in neck/thoracic pain with sitting at work   Status Achieved     PT SHORT TERM GOAL #3   Title report < or = to 5/10 leg pain with descending inclines when hiking   Time 4   Period Weeks   Status On-going           PT Long Term Goals - 11/23/15 2046  PT LONG TERM GOAL #1   Title be independent in advanced HEP   Time 8   Period Weeks   Status On-going     PT LONG TERM GOAL #2   Title reduce FOTO to < or = to 25% limitation    Time 8   Period Weeks   Status On-going     PT LONG TERM GOAL #3   Title report a 60% reduction in neck/thoracic pain with sitting at work   Time 8   Period Weeks   Status On-going     PT LONG TERM GOAL #4   Title report < or = to 3/10 bil. leg pain with descending inclines when hiking   Time 8   Period Weeks   Status On-going               Plan - 11/23/15 2037    Clinical Impression Statement The patient reports a flare-up of symptoms in right upper trap region which she attributes to dry needling.   Treatment focus today on pain management.  Discussed home strategies as well including her home TENS and proper intensity (recommended sensory level instead of motor contraction level she has been using), gentle ROM, frequent change of position.  Therapist closely monitoring response with all interventions.     PT Next  Visit Plan assess response to U/S and kinesiotaping;  seated thoracic extension, supine foam roll;  postural strength      Patient will benefit from skilled therapeutic intervention in order to improve the following deficits and impairments:     Visit Diagnosis: Cervicalgia     Problem List Patient Active Problem List   Diagnosis Date Noted  . Maxillary sinusitis 03/04/2015  . BPPV (benign paroxysmal positional vertigo) 03/04/2015  . Dizziness 03/04/2015  . Pleuritic chest pain 02/22/2015  . Constipation 12/18/2014  . Palpitations 12/18/2014  . Encounter to establish care 12/18/2014  . Health care maintenance 12/18/2014  . Vegetarian diet 12/18/2014  . Abnormal TSH 12/18/2014   Ruben Im, PT 11/23/15 8:48 PM Phone: [phone] Fax: [phone]  Alvera Singh 11/23/2015, 8:47 PM PHYSICAL THERAPY DISCHARGE SUMMARY  Visits from Start of Care: 3  Current functional level related to goals / functional outcomes: Pt attended 2 PT sessions and canceled remaining appointments.  See above for current status.     Remaining deficits: See above for most current status.     Education / Equipment: HEP Plan: Patient agrees to discharge.  Patient goals were not met. Patient is being discharged due to not returning since the last visit.  ?????         Sigurd Sos, PT 01/05/16 9:37 AM  [facility] [address], [place], [place], [address] Phone: [phone]   Fax:  [phone]  Name: Stacy Bush MRN: [id number] Date of Birth: 05/23/1970

## 2016-02-21 ENCOUNTER — Telehealth: Payer: Self-pay | Admitting: Family Medicine

## 2016-02-21 NOTE — Telephone Encounter (Signed)
Pt's son was diagnosed with the flu by teledoc and given Tamiflu. Mrs Stacy Bush wonders if she needs to be on Tamiflu as well? Son was really only sick for two days and she is concerned to take 10 days of medication if she does not need to. 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

## 2016-02-21 NOTE — Telephone Encounter (Signed)
I do not feel the diagnosis of her son with flu is 100% guarantee, considering no testing was done to verify (no flu swab on teledoc appts) and there is a virus presenting like flu, that is not the flu, but it is always possible especially if he was exposed.  This is her decision. If she would like the prophylaxis tx it can be once a day for 7 days, as long as she is not showing symptoms.

## 2016-02-21 NOTE — Telephone Encounter (Signed)
Called and left message for patient with information and instructions to call back if she decides to start Tamiflu

## 2016-02-22 NOTE — Telephone Encounter (Signed)
Patient has not returned call and is past the window for Tamiflu. If patient calls back we can schedule her to be seen for evaluation.

## 2016-07-19 ENCOUNTER — Telehealth: Payer: Self-pay | Admitting: Family Medicine

## 2016-07-19 ENCOUNTER — Encounter: Payer: Self-pay | Admitting: Family Medicine

## 2016-07-19 ENCOUNTER — Ambulatory Visit (INDEPENDENT_AMBULATORY_CARE_PROVIDER_SITE_OTHER): Payer: BC Managed Care – PPO | Admitting: Family Medicine

## 2016-07-19 VITALS — BP 114/76 | HR 82 | Temp 98.3°F | Resp 20 | Wt 157.5 lb

## 2016-07-19 DIAGNOSIS — L237 Allergic contact dermatitis due to plants, except food: Secondary | ICD-10-CM | POA: Diagnosis not present

## 2016-07-19 MED ORDER — FLUOCINOLONE ACETONIDE 0.01 % EX CREA: TOPICAL_CREAM | Freq: Two times a day (BID) | CUTANEOUS | 0 refills | Status: DC

## 2016-07-19 MED ORDER — METHYLPREDNISOLONE ACETATE 80 MG/ML IJ SUSP
80.0000 mg | Freq: Once | INTRAMUSCULAR | Status: AC
  Administered 2016-07-19: 80 mg via INTRAMUSCULAR

## 2016-07-19 NOTE — Progress Notes (Signed)
Stacy Bush , 1970-07-23, 46 y.o., female MRN: [id number] Patient Care Team    Relationship Specialty Notifications Start End  Natalia LeatherwoodKuneff, Abisai Deer A, DO PCP - General Family Medicine  12/18/14     Chief Complaint  Patient presents with  . Rash    possible poison ivy started on Sunday     Subjective: Pt presents for an OV with complaints of rash over neck, arms and legs of 4 days duration.  Associated symptoms include itching. She reports she continues to get more area of rash daily. It is not improving at all. She is concerned over the large patches over her neck and is worried it will go to her face or close off her breathing. She has had plant dermatitis in the past and used Vanos cream (steroid) which was affective for her.    Depression screen PHQ 2/9 12/18/2014  Decreased Interest 0  Down, Depressed, Hopeless 0  PHQ - 2 Score 0    No Known Allergies Social History  Substance Use Topics  . Smoking status: Never Smoker  . Smokeless tobacco: Never Used  . Alcohol use No   Past Medical History:  Diagnosis Date  . Allergy   . Anemia   . Anxiety   . Depression   . Urinary incontinence    pt states with excercise only.    Past Surgical History:  Procedure Laterality Date  . WISDOM TOOTH EXTRACTION     Family History  Problem Relation Age of Onset  . Hearing loss Mother   . COPD Father   . Heart disease Father   . Drug abuse Brother   . Alzheimer's disease Maternal Grandfather   . Diabetes Paternal Grandmother   . Drug abuse Paternal Grandfather   . Lung cancer Paternal Grandfather   . Breast cancer Neg Hx   . Colon cancer Neg Hx    Allergies as of 07/19/2016   No Known Allergies     Medication List       Accurate as of 07/19/16  3:43 PM. Always use your most recent med list.          Fish Oil 1000 MG Caps Take 1,000 mg by mouth daily.   fluticasone 50 MCG/ACT nasal spray Commonly known as:  FLONASE Place 2 sprays into both nostrils daily.     multivitamin capsule Take 1 capsule by mouth daily.   Vitamin D3 5000 units Caps Take by mouth.       All past medical history, surgical history, allergies, family history, immunizations andmedications were updated in the EMR today and reviewed under the history and medication portions of their EMR.     ROS: Negative, with the exception of above mentioned in HPI   Objective:  BP 114/76 (BP Location: Left Arm, Patient Position: Sitting, Cuff Size: Large)   Pulse 82   Temp 98.3 F (36.8 C)   Resp 20   Wt 157 lb 8 oz (71.4 kg)   SpO2 98%   BMI 23.95 kg/m  Body mass index is 23.95 kg/m. Gen: Afebrile. No acute distress. Nontoxic in appearance, well developed, well nourished.  HENT: AT. [place]. MMM, no oral lesions.  Eyes:Pupils Equal Round Reactive to light, Extraocular movements intact,  Conjunctiva without redness, discharge or icterus. Skin:  Red based vesicular rash neck, arms, legs. No purpura or petechiae.  Neuro: Normal gait. PERLA. EOMi. Alert. Oriented x3   No exam data present No results found. No results found for this or any  previous visit (from the past 24 hour(s)).  Assessment/Plan: Stacy Bush is a 46 y.o. female present for OV for  Poison oak dermatitis - Fluocinolone cream, precautions discussed and proper use.  - IM depo medrol today.  - benadryl for night time itching.  - calamine lotion OTC.  - F/U PRN   Reviewed expectations re: course of current medical issues.  Discussed self-management of symptoms.  Outlined signs and symptoms indicating need for more acute intervention.  Patient verbalized understanding and all questions were answered.  Patient received an After-Visit Summary.     Note is dictated utilizing voice recognition software. Although note has been proof read prior to signing, occasional typographical errors still can be missed. If any questions arise, please do not hesitate to call for verification.   electronically signed  by:  Felix Pacini, DO  [facility] [place]

## 2016-07-19 NOTE — Telephone Encounter (Signed)
Patient has poison ivy. It is waking her up at night. Her past PCP Rx'd Vanos cream. Can an Rx be sent to [facility]?

## 2016-07-19 NOTE — Addendum Note (Signed)
Addended by: Thomasena EdisWILLIAMS, Lexia Vandevender N on: 07/19/2016 04:32 PM   Modules accepted: Orders

## 2016-07-19 NOTE — Telephone Encounter (Signed)
called patient and scheduled appt for evaluation.

## 2016-07-19 NOTE — Patient Instructions (Signed)
Steroid shot today should help healing process.  Steroid cream prescribed.  Calamine and/or other drying lotions/soaps etc to help clear.   Use benadryl if needed at night to help take the itching down, so you do not itch/scratch during your sleep.     Poison Oak Dermatitis Poison oak dermatitis is redness and soreness (inflammation) of the skin. It is caused by a chemical that is on the leaves of the poison oak plant. You may also have itching, a rash, and blisters. Symptoms often clear up in 1-2 weeks. You may get this condition by touching a poison oak plant. You can also get it by touching something that has the chemical on it. This may include animals or objects that have come in contact with the plant. Follow these instructions at home: General instructions  Take or apply over-the-counter and prescription medicines only as told by your doctor.  If you touch poison oak, wash your skin with soap and cold water right away.  Use hydrocortisone creams or calamine lotion as needed to help with itching.  Take oatmeal baths as needed. Use colloidal oatmeal. You can get this at a pharmacy or grocery store. Follow the instructions on the package.  Do not scratch or rub your skin.  While you have the rash, wash your clothes right after you wear them. Prevention   Know what poison oak looks like so you can avoid it. This plant has three leaves with flowering branches on a single stem. The leaves are fuzzy. They have a toothlike edge.  If you have touched poison oak, wash with soap and water right away. Be sure to wash under your fingernails.  When hiking or camping, wear long pants, a long-sleeved shirt, tall socks, and hiking boots. You can also use a lotion on your skin that helps to prevent contact with the chemical on the plant.  If you think that your clothes or outdoor gear came in contact with poison oak, rinse them off with a garden hose before you bring them inside your  house. Contact a doctor if:  You have open sores in the rash area.  You have more redness, swelling, or pain in the affected area.  You have redness that spreads beyond the rash area.  You have fluid, blood, or pus coming from the affected area.  You have a fever.  You have a rash over a large area of your body.  You have a rash on your eyes, mouth, or genitals.  Your rash does not improve after a few days. Get help right away if:  Your face swells or your eyes swell shut.  You have trouble breathing.  You have trouble swallowing. This information is not intended to replace advice given to you by your health care provider. Make sure you discuss any questions you have with your health care provider. Document Released: 02/25/2010 Document Revised: 07/01/2015 Document Reviewed: 07/01/2014 Elsevier Interactive Patient Education  [employer]2018 Elsevier Inc.

## 2016-09-13 ENCOUNTER — Ambulatory Visit (INDEPENDENT_AMBULATORY_CARE_PROVIDER_SITE_OTHER): Payer: BC Managed Care – PPO | Admitting: Family Medicine

## 2016-09-13 ENCOUNTER — Encounter: Payer: Self-pay | Admitting: Family Medicine

## 2016-09-13 VITALS — BP 114/76 | HR 73 | Temp 98.3°F | Resp 20 | Wt 153.0 lb

## 2016-09-13 DIAGNOSIS — M62838 Other muscle spasm: Secondary | ICD-10-CM

## 2016-09-13 DIAGNOSIS — M542 Cervicalgia: Secondary | ICD-10-CM

## 2016-09-13 MED ORDER — CYCLOBENZAPRINE HCL 10 MG PO TABS: 10.0000 mg | ORAL_TABLET | Freq: Three times a day (TID) | ORAL | 0 refills | Status: DC | PRN

## 2016-09-13 MED ORDER — TRAMADOL HCL 50 MG PO TABS: 100.0000 mg | ORAL_TABLET | Freq: Three times a day (TID) | ORAL | 0 refills | Status: DC | PRN

## 2016-09-13 MED ORDER — PREDNISONE 20 MG PO TABS: ORAL_TABLET | ORAL | 0 refills | Status: DC

## 2016-09-13 MED ORDER — METHYLPREDNISOLONE ACETATE 80 MG/ML IJ SUSP
80.0000 mg | Freq: Once | INTRAMUSCULAR | Status: AC
  Administered 2016-09-13: 80 mg via INTRAMUSCULAR

## 2016-09-13 NOTE — Progress Notes (Signed)
Stacy Bush , 1970/08/24, 46 y.o., female MRN: [id number] Patient Care Team    Relationship Specialty Notifications Start End  Natalia LeatherwoodKuneff, Kalyna Paolella A, DO PCP - General Family Medicine  12/18/14     Chief Complaint  Patient presents with  . Shoulder Pain    radiates into arm and scapula right side x 3 days     Subjective: Pt presents for an OV with complaints of Neck and shoulder pain 2 days ago. Patient reports 2 days ago she woke up with a stiff neck in the morning, and by the afternoon it had radiated all the way down next to her shoulder blade. She states the pain has become proximally and 9/10 after having a massage and chiropractic treatment. She points to her shoulder and her deltoid as the area of most discomfort. She admits to some mild radiation down into her forearm on occasions. She has been using ice, Aleve, tramadol. Nothing is helping her discomfort, she is holding her arm up and states this is the most comfortable position for her. When she straightens out her arm there is more pain. She doesn't recall a specific injury, or change in activity prior to onset. She feels there is a large not by her shoulder blade.   Depression screen PHQ 2/9 12/18/2014  Decreased Interest 0  Down, Depressed, Hopeless 0  PHQ - 2 Score 0    No Known Allergies Social History  Substance Use Topics  . Smoking status: Never Smoker  . Smokeless tobacco: Never Used  . Alcohol use No   Past Medical History:  Diagnosis Date  . Allergy   . Anemia   . Anxiety   . Depression   . Urinary incontinence    pt states with excercise only.    Past Surgical History:  Procedure Laterality Date  . WISDOM TOOTH EXTRACTION     Family History  Problem Relation Age of Onset  . Hearing loss Mother   . COPD Father   . Heart disease Father   . Drug abuse Brother   . Alzheimer's disease Maternal Grandfather   . Diabetes Paternal Grandmother   . Drug abuse Paternal Grandfather   . Lung cancer Paternal  Grandfather   . Breast cancer Neg Hx   . Colon cancer Neg Hx    Allergies as of 09/13/2016   No Known Allergies     Medication List       Accurate as of 09/13/16  1:41 PM. Always use your most recent med list.          Fish Oil 1000 MG Caps Take 1,000 mg by mouth daily.   multivitamin capsule Take 1 capsule by mouth daily.   Vitamin D3 5000 units Caps Take by mouth.       All past medical history, surgical history, allergies, family history, immunizations andmedications were updated in the EMR today and reviewed under the history and medication portions of their EMR.     ROS: Negative, with the exception of above mentioned in HPI   Objective:  BP 114/76 (BP Location: Left Arm, Patient Position: Sitting, Cuff Size: Normal)   Pulse 73   Temp 98.3 F (36.8 C)   Resp 20   Wt 153 lb (69.4 kg)   SpO2 100%   BMI 23.26 kg/m  Body mass index is 23.26 kg/m. Gen: Afebrile. No acute distress. Nontoxic in appearance, well developed, well nourished.  HENT: AT. [place]. MMM, no oral lesions.  Eyes:Pupils Equal Round Reactive  to light, Extraocular movements intact,  Conjunctiva without redness, discharge or icterus. MSK:     - Cervical spine: No erythema, no soft tissue swelling, right paraspinal muscle spasm present. No bone tenderness. Tenderness to palpation cervical paraspinal muscles. Full range of motion of cervical spine, with discomfort on extension and right side bending. Neurovascularly intact distally.    - Right shoulder: No erythema, no soft tissue swelling, upper trap muscle spasm, with tenderness to palpation. No biceps tendon tenderness. full range of motion of bilateral arms. Negative Hawkins, negative empty can test. Negative O'Brien's. Skin: no rashes, purpura or petechiae.  Neuro: Normal gait. PERLA. EOMi. Alert. Oriented x3    No exam data present No results found. No results found for this or any previous visit (from the past 24  hour(s)).  Assessment/Plan: Stacy Bush is a 46 y.o. female present for OV for  Neck pain/shouder pain/ muscle strain: - Patient appears to have muscle spasm in her right upper trap.  - Heat application, light massage, IM Depo-Medrol today, Flexeril, tramadol for breakthrough pain. - Start light stretches in a few days. - methylPREDNISolone acetate (DEPO-MEDROL) injection 80 mg; Inject 1 mL (80 mg total) into the muscle once. - Follow-up in 2 weeks if no improvement.   Reviewed expectations re: course of current medical issues.  Discussed self-management of symptoms.  Outlined signs and symptoms indicating need for more acute intervention.  Patient verbalized understanding and all questions were answered.  Patient received an After-Visit Summary.    No orders of the defined types were placed in this encounter.    Note is dictated utilizing voice recognition software. Although note has been proof read prior to signing, occasional typographical errors still can be missed. If any questions arise, please do not hesitate to call for verification.   electronically signed by:  Felix Pacini, DO  [facility] [place]

## 2016-09-13 NOTE — Patient Instructions (Signed)
Steroid shot today, prednisone start tomorrow.  Start flexeril today---> muscle relaxer, caution on sedation.  Tramadol up to 100 mg every 8 hours if needed.  Heat application, Biofreeze is good.  Start stretching lightly in 5-7 days.   Follow up in 2 weeks if not improving.    Cervical Sprain A cervical sprain is a stretch or tear in the tissues that connect bones (ligaments) in the neck. Most neck (cervical) sprains get better in 4-6 weeks. Follow these instructions at home: If you have a neck collar:  Wear it as told by your doctor. Do not take off (do not remove) the collar unless your doctor says that this is safe.  Ask your doctor before adjusting your collar.  If you have long hair, keep it outside of the collar.  Ask your doctor if you may take off the collar for cleaning and bathing. If you may take off the collar: ? Follow instructions from your doctor about how to take off the collar safely. ? Clean the collar by wiping it with mild soap and water. Let it air-dry all the way. ? If your collar has removable pads:  Take the pads out every 1-2 days.  Hand wash the pads with soap and water.  Let the pads air-dry all the way before you put them back in the collar. Do not dry them in a clothes dryer. Do not dry them with a hair dryer. ? Check your skin under the collar for irritation or sores. If you see any, tell your doctor. Managing pain, stiffness, and swelling  Use a cervical traction device, if told by your doctor.  If told, put heat on the affected area. Do this before exercises (physical therapy) or as often as told by your doctor. Use the heat source that your doctor recommends, such as a moist heat pack or a heating pad. ? Place a towel between your skin and the heat source. ? Leave the heat on for 20-30 minutes. ? Take the heat off (remove the heat) if your skin turns bright red. This is very important if you cannot feel pain, heat, or cold. You may have a  greater risk of getting burned.  Put ice on the affected area. ? Put ice in a plastic bag. ? Place a towel between your skin and the bag. ? Leave the ice on for 20 minutes, 2-3 times a day. Activity  Do not drive while wearing a neck collar. If you do not have a neck collar, ask your doctor if it is safe to drive.  Do not drive or use heavy machinery while taking prescription pain medicine or muscle relaxants, unless your doctor approves.  Do not lift anything that is heavier than 10 lb (4.5 kg) until your doctor tells you that it is safe.  Rest as told by your doctor.  Avoid activities that make you feel worse. Ask your doctor what activities are safe for you.  Do exercises as told by your doctor or physical therapist. Preventing neck sprain  Practice good posture. Adjust your workstation to help with this, if needed.  Exercise regularly as told by your doctor or physical therapist.  Avoid activities that are risky or may cause a neck sprain (cervical sprain). General instructions  Take over-the-counter and prescription medicines only as told by your doctor.  Do not use any products that contain nicotine or tobacco. This includes cigarettes and e-cigarettes. If you need help quitting, ask your doctor.  Keep all follow-up  visits as told by your doctor. This is important. Contact a doctor if:  You have pain or other symptoms that get worse.  You have symptoms that do not get better after 2 weeks.  You have pain that does not get better with medicine.  You start to have new, unexplained symptoms.  You have sores or irritated skin from wearing your neck collar. Get help right away if:  You have very bad pain.  You have any of the following in any part of your body: ? Loss of feeling (numbness). ? Tingling. ? Weakness.  You cannot move a part of your body (you have paralysis).  Your activity level does not improve. Summary  A cervical sprain is a stretch or tear  in the tissues that connect bones (ligaments) in the neck.  If you have a neck (cervical) collar, do not take off the collar unless your doctor says that this is safe.  Put ice on affected areas as told by your doctor.  Put heat on affected areas as told by your doctor.  Good posture and regular exercise can help prevent a neck sprain from happening again. This information is not intended to replace advice given to you by your health care provider. Make sure you discuss any questions you have with your health care provider. Document Released: 07/12/2007 Document Revised: 10/05/2015 Document Reviewed: 10/05/2015 Elsevier Interactive Patient Education  2017 [employer]

## 2016-09-15 DIAGNOSIS — M62838 Other muscle spasm: Secondary | ICD-10-CM | POA: Insufficient documentation

## 2016-09-15 DIAGNOSIS — M542 Cervicalgia: Secondary | ICD-10-CM | POA: Insufficient documentation

## 2016-09-27 ENCOUNTER — Telehealth: Payer: Self-pay | Admitting: Family Medicine

## 2016-09-27 ENCOUNTER — Ambulatory Visit (INDEPENDENT_AMBULATORY_CARE_PROVIDER_SITE_OTHER): Payer: BC Managed Care – PPO | Admitting: Family Medicine

## 2016-09-27 ENCOUNTER — Encounter: Payer: Self-pay | Admitting: Family Medicine

## 2016-09-27 VITALS — BP 117/78 | HR 93 | Temp 98.2°F | Resp 20 | Wt 151.2 lb

## 2016-09-27 DIAGNOSIS — M542 Cervicalgia: Secondary | ICD-10-CM | POA: Diagnosis not present

## 2016-09-27 DIAGNOSIS — M5412 Radiculopathy, cervical region: Secondary | ICD-10-CM

## 2016-09-27 DIAGNOSIS — M62838 Other muscle spasm: Secondary | ICD-10-CM | POA: Diagnosis not present

## 2016-09-27 MED ORDER — MELOXICAM 15 MG PO TABS: 15.0000 mg | ORAL_TABLET | Freq: Every day | ORAL | 0 refills | Status: DC

## 2016-09-27 MED ORDER — PREGABALIN 75 MG PO CAPS: 75.0000 mg | ORAL_CAPSULE | Freq: Two times a day (BID) | ORAL | 1 refills | Status: DC

## 2016-09-27 MED ORDER — METAXALONE 800 MG PO TABS: 800.0000 mg | ORAL_TABLET | Freq: Three times a day (TID) | ORAL | 0 refills | Status: DC

## 2016-09-27 NOTE — Telephone Encounter (Signed)
Patient has finished prednisone. Her nerve pain has not resolved. Please advise.

## 2016-09-27 NOTE — Patient Instructions (Signed)
Start Lyrica. New muscle relaxer. And mobic.  Referral to Dr. Katrinka Blazing.    Followup in 8 weeks if not improved sooner if worsening.

## 2016-09-27 NOTE — Progress Notes (Signed)
Stacy Bush , [birth year], 46 y.o., female MRN: [id number] Patient Care Team    Relationship Specialty Notifications Start End  Natalia Leatherwood, DO PCP - General Family Medicine  12/18/14     Chief Complaint  Patient presents with  . Shoulder Pain    radiates down arm right side     Subjective:  Patient presents today for follow-up on her neck pain which started approximately 09/11/2016 after patient reportedly "slept on her neck wrong. " Since being seen last she has finished the steroid taper, stopped using the Flexeril, and uses the tramadol when necessary. She has been using heat therapy, deep blue balm, and a nerve stimulator. She has been performing stretches at home, which seemed to have helped greatly. She states the discomfort is still waking her at night. She feels the shoulder pain is worse in the morning. She describes the pain as later in the day more of a deep ache that sometimes goes down into her hand. She reports she has seen her chiropractor within the last few weeks and he performed a cervical spine x-ray which she states had no bone spurs or acute issues. No records or images available for review.   Prior note 09/13/2016: Pt presents for an OV with complaints of Neck and shoulder pain 2 days ago. Patient reports 2 days ago she woke up with a stiff neck in the morning, and by the afternoon it had radiated all the way down next to her shoulder blade. She states the pain has become proximally and 9/10 after having a massage and chiropractic treatment. She points to her shoulder and her deltoid as the area of most discomfort. She admits to some mild radiation down into her forearm on occasions. She has been using ice, Aleve, tramadol. Nothing is helping her discomfort, she is holding her arm up and states this is the most comfortable position for her. When she straightens out her arm there is more pain. She doesn't recall a specific injury, or change in activity prior to onset. She  feels there is a large not by her shoulder blade.   Depression screen PHQ 2/9 12/18/2014  Decreased Interest 0  Down, Depressed, Hopeless 0  PHQ - 2 Score 0    No Known Allergies Social History  Substance Use Topics  . Smoking status: Never Smoker  . Smokeless tobacco: Never Used  . Alcohol use No   Past Medical History:  Diagnosis Date  . Allergy   . Anemia   . Anxiety   . Depression   . Urinary incontinence    pt states with excercise only.    Past Surgical History:  Procedure Laterality Date  . WISDOM TOOTH EXTRACTION     Family History  Problem Relation Age of Onset  . Hearing loss Mother   . COPD Father   . Heart disease Father   . Drug abuse Brother   . Alzheimer's disease Maternal Grandfather   . Diabetes Paternal Grandmother   . Drug abuse Paternal Grandfather   . Lung cancer Paternal Grandfather   . Breast cancer Neg Hx   . Colon cancer Neg Hx    Allergies as of 09/27/2016   No Known Allergies     Medication List       Accurate as of 09/27/16  2:40 PM. Always use your most recent med list.          cyclobenzaprine 10 MG tablet Commonly known as:  FLEXERIL Take 1 tablet (  10 mg total) by mouth 3 (three) times daily as needed for muscle spasms.   Fish Oil 1000 MG Caps Take 1,000 mg by mouth daily.   multivitamin capsule Take 1 capsule by mouth daily.   naproxen sodium 220 MG tablet Commonly known as:  ANAPROX Take 220 mg by mouth 2 (two) times daily with a meal.   traMADol 50 MG tablet Commonly known as:  ULTRAM Take 2 tablets (100 mg total) by mouth every 8 (eight) hours as needed.   Vitamin D3 5000 units Caps Take by mouth.       All past medical history, surgical history, allergies, family history, immunizations andmedications were updated in the EMR today and reviewed under the history and medication portions of their EMR.     ROS: Negative, with the exception of above mentioned in HPI   Objective:  BP 117/78 (BP Location:  Right Arm, Patient Position: Sitting, Cuff Size: Normal)   Pulse 93   Temp 98.2 F (36.8 C)   Resp 20   Wt 151 lb 4 oz (68.6 kg)   SpO2 97%   BMI 23.00 kg/m  Body mass index is 23 kg/m.  Gen: Afebrile. No acute distress. Nontoxic in appearance, well-developed, well-nourished, Caucasian female. Looks improved and not in discomfort. HENT: AT. [place].  MMM.  Eyes:Pupils Equal Round Reactive to light, Extraocular movements intact,  Conjunctiva without redness, discharge or icterus. MSK:     - Cervical spine: No erythema, no soft tissue swelling, right paraspinal muscle spasm resolved. No bony tenderness. Tender to palpation supraspinatus. Full range of motion cervical spine with mild discomfort on left rotation. Neurovascularly intact distally     - Right shoulder: No erythema, no soft tissue swelling, no tenderness to palpation over biceps tendon or deltoid. Full range of motion bilateral arms. Negative Hawkins, negative empty can test, negative O'Brien's. Negative Tinel's at elbow and wrist. Neurovascularly intact distally.  Skin: no rashes, purpura or petechiae.  Neuro: Normal gait. PERLA. EOMi. Alert. Oriented x3    No exam data present No results found. No results found for this or any previous visit (from the past 24 hour(s)).  Assessment/Plan: Stacy Bush is a 46 y.o. female present for OV for  Neck pain/shouder pain/ muscle strain: - Patient still complains of discomfort, she appears much improved within the last 2 weeks. Would have move forward with imaging studies, however she states that her chiropractor has completed these for her and there were no acute issues. There are no records received for that visit. - Continue heat application, continue stretches, light massage, tramadol when necessary. - Discussed different therapeutic options including OMT and medications. Patient is interested in OMT, referral to sports medicine has been placed. - Discontinued Flexeril, and start  Skelaxin to allow her to take throughout the day without sedation. - Patient is agreeable to start Lyrica to help with radicular symptoms. Medication prescribed today. - Taper off the tramadol, use only as needed for breakthrough pain. Start daily meloxicam. - Follow-up in 8 weeks, sooner if worsening  Reviewed expectations re: course of current medical issues.  Discussed self-management of symptoms.  Outlined signs and symptoms indicating need for more acute intervention.  Patient verbalized understanding and all questions were answered.  Patient received an After-Visit Summary.    No orders of the defined types were placed in this encounter.    Note is dictated utilizing voice recognition software. Although note has been proof read prior to signing, occasional typographical errors still can  be missed. If any questions arise, please do not hesitate to call for verification.   electronically signed by:  Howard Pouch, DO  [place]

## 2016-09-27 NOTE — Telephone Encounter (Signed)
Per Dr Alan Ripper last office visit note if patient had no improvement in symptoms in 2 weeks she was to come back to be seen. Called patient and scheduled an appt .

## 2016-09-29 ENCOUNTER — Telehealth: Payer: Self-pay | Admitting: Family Medicine

## 2016-09-29 NOTE — Telephone Encounter (Signed)
Patient states she was started on 3 new meds yesterday:  meloxicam (MOBIC) 15 MG tablet  metaxalone (SKELAXIN) 800 MG tablet  pregabalin (LYRICA) 75 MG capsule  She states she did not have any pain yesterday after starting the meds.  However, today she is having severe pain.  She wants to know if this is to be expected with these meds.   Patient also reports experiencing dizziness yesterday, but no pain.  However, today she has not experienced any dizziness but rather pain.  Patient states she spoke with Diane yesterday regarding referral to sports med.  She is waiting to hear back from Dr. Claiborne Billings to advise if ok to see Dr. Berline Chough instead of Dr. Katrinka Blazing due to availability.

## 2016-09-29 NOTE — Telephone Encounter (Signed)
Spoke with patient explained to her Dr Claiborne Billings is out of the office until Monday I will review referral information with her when she gets back and let patient know. Advised patient she can speak with pharmacist regarding medication side effects. Patient states she doesn't see any improvement since starting medication yesterday. Explained to patient it takes more than one day of dosing to see any improvement. Advised patient if her pain gets worse she needs to go to ER for evaluation.

## 2016-10-02 NOTE — Telephone Encounter (Signed)
This has already been eddressed

## 2016-10-04 ENCOUNTER — Encounter: Payer: Self-pay | Admitting: Sports Medicine

## 2016-10-04 ENCOUNTER — Ambulatory Visit (INDEPENDENT_AMBULATORY_CARE_PROVIDER_SITE_OTHER): Payer: BC Managed Care – PPO | Admitting: Sports Medicine

## 2016-10-04 VITALS — BP 116/74 | HR 85 | Ht 68.0 in | Wt 154.6 lb

## 2016-10-04 DIAGNOSIS — M5412 Radiculopathy, cervical region: Secondary | ICD-10-CM | POA: Diagnosis not present

## 2016-10-04 DIAGNOSIS — M542 Cervicalgia: Secondary | ICD-10-CM | POA: Diagnosis not present

## 2016-10-04 HISTORY — DX: Radiculopathy, cervical region: M54.12

## 2016-10-04 NOTE — Progress Notes (Signed)
OFFICE VISIT NOTE Stacy Bush. [facility] [phone]  Stacy Bush - 46 y.o. female MRN [id number]  Date of birth: 12-04-1970  Visit Date: 10/04/2016  PCP: Natalia Leatherwood, DO   Referred by: Natalia Leatherwood, DO  Orlie Dakin, CMA acting as scribe for Dr. Berline Chough.  SUBJECTIVE:   Chief Complaint  Patient presents with  . New Patient (Initial Visit)    neck pain   HPI: As below and per problem based documentation when appropriate.  Stacy Bush is a new patient referred by Dr. Claiborne Billings for evaluation of right shoulder pain. Pain is mostly around the deltoid, upper back, and forearm.  Pain started about 3 weeks ago.  No known injury or trauma, she just "woke up like that" with pain in rt shoulder and scapula that radiates into the right arm, hand, and fingers.   The pain is described as sharp pains in the morning that turned into deep wrenching ache.  Pain is rated around 9/10 first thing in the morning but 3/10 when taking medication.  Worsened when waking up in the morning. She has started sleeping on her back to see if that helps with the pain.  Improves with ROM exercises and massage. Pain improves when she holds her arm over her head.  Therapies tried include : She was initially taking prednisone, tramadol, and a muscle relaxer. She completed the Prednisone with no minimal relief. Tramadol and muscle relaxer provided little relief. She was then prescribed Lyrica, Skelaxin, and Mobic. The first day on these medication for felt 90% better. She feels overall it was working well, about 60% improvement. Pain seems to very day by day.   Pt reports that her right arm seems to be cold intolerant.     Review of Systems  Constitutional: Negative for chills and fever.  Respiratory: Negative for shortness of breath and wheezing.   Cardiovascular: Positive for palpitations. Negative for chest pain.  Musculoskeletal: Negative for falls.    Neurological: Positive for tingling. Negative for dizziness and headaches.    Otherwise per HPI.  HISTORY & PERTINENT PRIOR DATA:  No specialty comments available. She reports that she has never smoked. She has never used smokeless tobacco. No results for input(s): HGBA1C, LABURIC in the last 8760 hours. Medications & Allergies reviewed per EMR Patient Active Problem List   Diagnosis Date Noted  . Cervical radiculopathy 10/04/2016  . Muscle spasm 09/15/2016  . Neck pain 09/15/2016  . Maxillary sinusitis 03/04/2015  . BPPV (benign paroxysmal positional vertigo) 03/04/2015  . Pleuritic chest pain 02/22/2015  . Constipation 12/18/2014  . Palpitations 12/18/2014  . Encounter to establish care 12/18/2014  . Health care maintenance 12/18/2014  . Vegetarian diet 12/18/2014  . Abnormal TSH 12/18/2014   Past Medical History:  Diagnosis Date  . Allergy   . Anemia   . Anxiety   . Depression   . Urinary incontinence    pt states with excercise only.    Family History  Problem Relation Age of Onset  . Hearing loss Mother   . COPD Father   . Heart disease Father   . Drug abuse Brother   . Alzheimer's disease Maternal Grandfather   . Diabetes Paternal Grandmother   . Drug abuse Paternal Grandfather   . Lung cancer Paternal Grandfather   . Breast cancer Neg Hx   . Colon cancer Neg Hx    Past Surgical History:  Procedure Laterality Date  .  WISDOM TOOTH EXTRACTION     Social History   Occupational History  . Not on file.   Social History Main Topics  . Smoking status: Never Smoker  . Smokeless tobacco: Never Used  . Alcohol use No  . Drug use: No  . Sexual activity: Yes     Comment: husband vasectomy    OBJECTIVE:  VS:  HT:5\' 8"  (172.7 cm)   WT:154 lb 9.6 oz (70.1 kg)  BMI:23.51    BP:116/74  HR:85bpm  TEMP: ( )  RESP:97 % EXAM: Findings:  WDWN, NAD, Non-toxic appearing Alert & appropriately interactive Not depressed or anxious appearing No increased work of  breathing. Pupils are equal. EOM intact without nystagmus No clubbing or cyanosis of the extremities appreciated No significant rashes/lesions/ulcerations overlying the examined area. Radial pulses 2+/4.  No significant generalized UE edema. Sensation intact to light touch in upper extremities.  Neck & Shoulders: Well aligned, no significant torticollis No significant midline tenderness.   Mild TTP over the low cervical paracervical musculature right greater than left. Cervical ROM:       Overall well-maintained cervical range of motion with sidebending rotation and flexion extension.  She does have slight discrepancy from right to left rotation but this is minimal. NEURAL TENSION SIGNS Right       Brachial Plexus Squeeze: Non-tender       Arm Squeeze Test: Slightly tender, positive radicular response into the hand      Spurling's Compression Test:  Ipsilateral -Negative/ No radiation  Left       Brachial Plexus Squeeze: Non-tender       Arm Squeeze Test: Slightly tender, without radiation      Spurling's Compression Test:  Ipsilateral -Negative/ No radiation Lhermitte's Compression test:  Negative/ No radiation   REFLEXES                           Right                         Left DTR - C5 -Biceps               Trace                       Trace DTR - C6 - Brachiorad  Trace                       Trace DTR - C7 - Triceps              absent                      absent UMN - Hoffman's Negative/Normal Negative/Normal  MOTOR TESTING: Intact manual muscle testing diffusely with the exception of triceps extension which is markedly weak at 3 out of 5 strength.     No results found. ASSESSMENT & PLAN:     ICD-10-CM   1. Neck pain M54.2   2. Cervicalgia M54.2 MR Cervical Spine Wo Contrast  3. Cervical radiculopathy M54.12   ================================================================= Cervical radiculopathy Patient has had intermittent symptoms of neck and arm pain for with  poor response to physical therapy in the past as well as lack of improvement with conservative measures including steroid Dosepak, gabapentinoids, therapeutic exercises and chiropractic manipulation.  She is profoundly weak today with triceps extension no concern for significant cervical disc herniation.  MRI of  the cervical spine indicated at this time and we will plan to follow-up with her after this is obtained.  She does have an upcoming move to recommend that she avoid any significant heavy lifting.  If any exacerbation of symptoms she should cut back on her involvement in the move.  No changes to medications at this time.  Neck pain >50% of this 30 minute visit spent in direct patient counseling and/or coordination of care.  Discussion was focused on education regarding the in discussing the pathoetiology and anticipated clinical course of the above condition.  This has been an ongoing issue for her for quite some time.  She will need postural exercises at follow-up but given the weakness we need to discuss the need for potential further intervention to prevent potential long-term nerve damage.  Can consider EMGs if MRI is nondiagnostic. =================================================================  Follow-up: Return for MRI review.   CMA/ATC served as Neurosurgeon during this visit. History, Physical, and Plan performed by medical provider. Documentation and orders reviewed and attested to.      Gaspar Bidding, DO    [facility] Phys[facility]

## 2016-10-04 NOTE — Assessment & Plan Note (Signed)
>  50% of this 30 minute visit spent in direct patient counseling and/or coordination of care.  Discussion was focused on education regarding the in discussing the pathoetiology and anticipated clinical course of the above condition.  This has been an ongoing issue for her for quite some time.  She will need postural exercises at follow-up but given the weakness we need to discuss the need for potential further intervention to prevent potential long-term nerve damage.  Can consider EMGs if MRI is nondiagnostic.

## 2016-10-04 NOTE — Patient Instructions (Signed)

## 2016-10-04 NOTE — Assessment & Plan Note (Signed)
Patient has had intermittent symptoms of neck and arm pain for with poor response to physical therapy in the past as well as lack of improvement with conservative measures including steroid Dosepak, gabapentinoids, therapeutic exercises and chiropractic manipulation.  She is profoundly weak today with triceps extension no concern for significant cervical disc herniation.  MRI of the cervical spine indicated at this time and we will plan to follow-up with her after this is obtained.  She does have an upcoming move to recommend that she avoid any significant heavy lifting.  If any exacerbation of symptoms she should cut back on her involvement in the move.  No changes to medications at this time.

## 2016-10-05 ENCOUNTER — Telehealth: Payer: Self-pay | Admitting: Sports Medicine

## 2016-10-05 NOTE — Telephone Encounter (Signed)
Patient called to have her MRI referral location changed to [facility] on [address] She called and they can get her in sooner than [facility]. Call patient to update once this has been done.

## 2016-10-06 NOTE — Telephone Encounter (Signed)
Called pt and advised that new order for MRI sent to [facility].

## 2016-10-06 NOTE — Telephone Encounter (Signed)
Please call and notify patient that the order was faxed, she wanted an update once this has been done.

## 2016-10-06 NOTE — Telephone Encounter (Signed)
Order has been faxed to [facility].

## 2016-10-10 ENCOUNTER — Telehealth: Payer: Self-pay | Admitting: Family Medicine

## 2016-10-10 ENCOUNTER — Telehealth: Payer: Self-pay | Admitting: Sports Medicine

## 2016-10-10 NOTE — Telephone Encounter (Signed)
Patient calling to advise [facility] did not receive referral. Asking to be re-faxed to: [phone].  Please call patient to advise when done.

## 2016-10-10 NOTE — Telephone Encounter (Signed)
Mellody DanceKeith has refaxed order and contacted pt to advise.

## 2016-10-10 NOTE — Telephone Encounter (Signed)
Patient is requesting a CB after her CT results come in.

## 2016-10-11 NOTE — Telephone Encounter (Signed)
Spoke with patient she was asking if Dr Claiborne BillingsKuneff could review with her results of MRI ordered by Dr Berline Choughigby when she has it dione so she can save on her co-pay. Explained to patient that the ordering physician will review and make recommendations for treatment based on results and his evaluation. Dr Claiborne BillingsKuneff made referral to him so patient could get the best treatment and we cannot review results with her to save her money . Patient verbalized understanding.

## 2016-10-14 ENCOUNTER — Other Ambulatory Visit: Payer: BC Managed Care – PPO

## 2016-10-23 ENCOUNTER — Other Ambulatory Visit: Payer: Self-pay | Admitting: *Deleted

## 2016-10-23 MED ORDER — MELOXICAM 15 MG PO TABS
15.0000 mg | ORAL_TABLET | Freq: Every day | ORAL | 0 refills | Status: DC
Start: 2016-10-23 — End: 2017-05-03

## 2016-10-23 NOTE — Telephone Encounter (Signed)
Request for refill on meloxicam received last refilled 09/27/16 for 30 tabs patient last seen here on 09/27/16. Please advise.

## 2016-10-30 IMAGING — DX DG CHEST 2V
2 series · 2 of 2 positions shown · non-contrast
Comparison: Chest x-ray dated 02/24/2015.

CLINICAL DATA: Follow-up from infiltrate 3-4 weeks ago. Still with
sharp left-sided chest pains.

EXAM:
CHEST  2 VIEW

[chest pa]
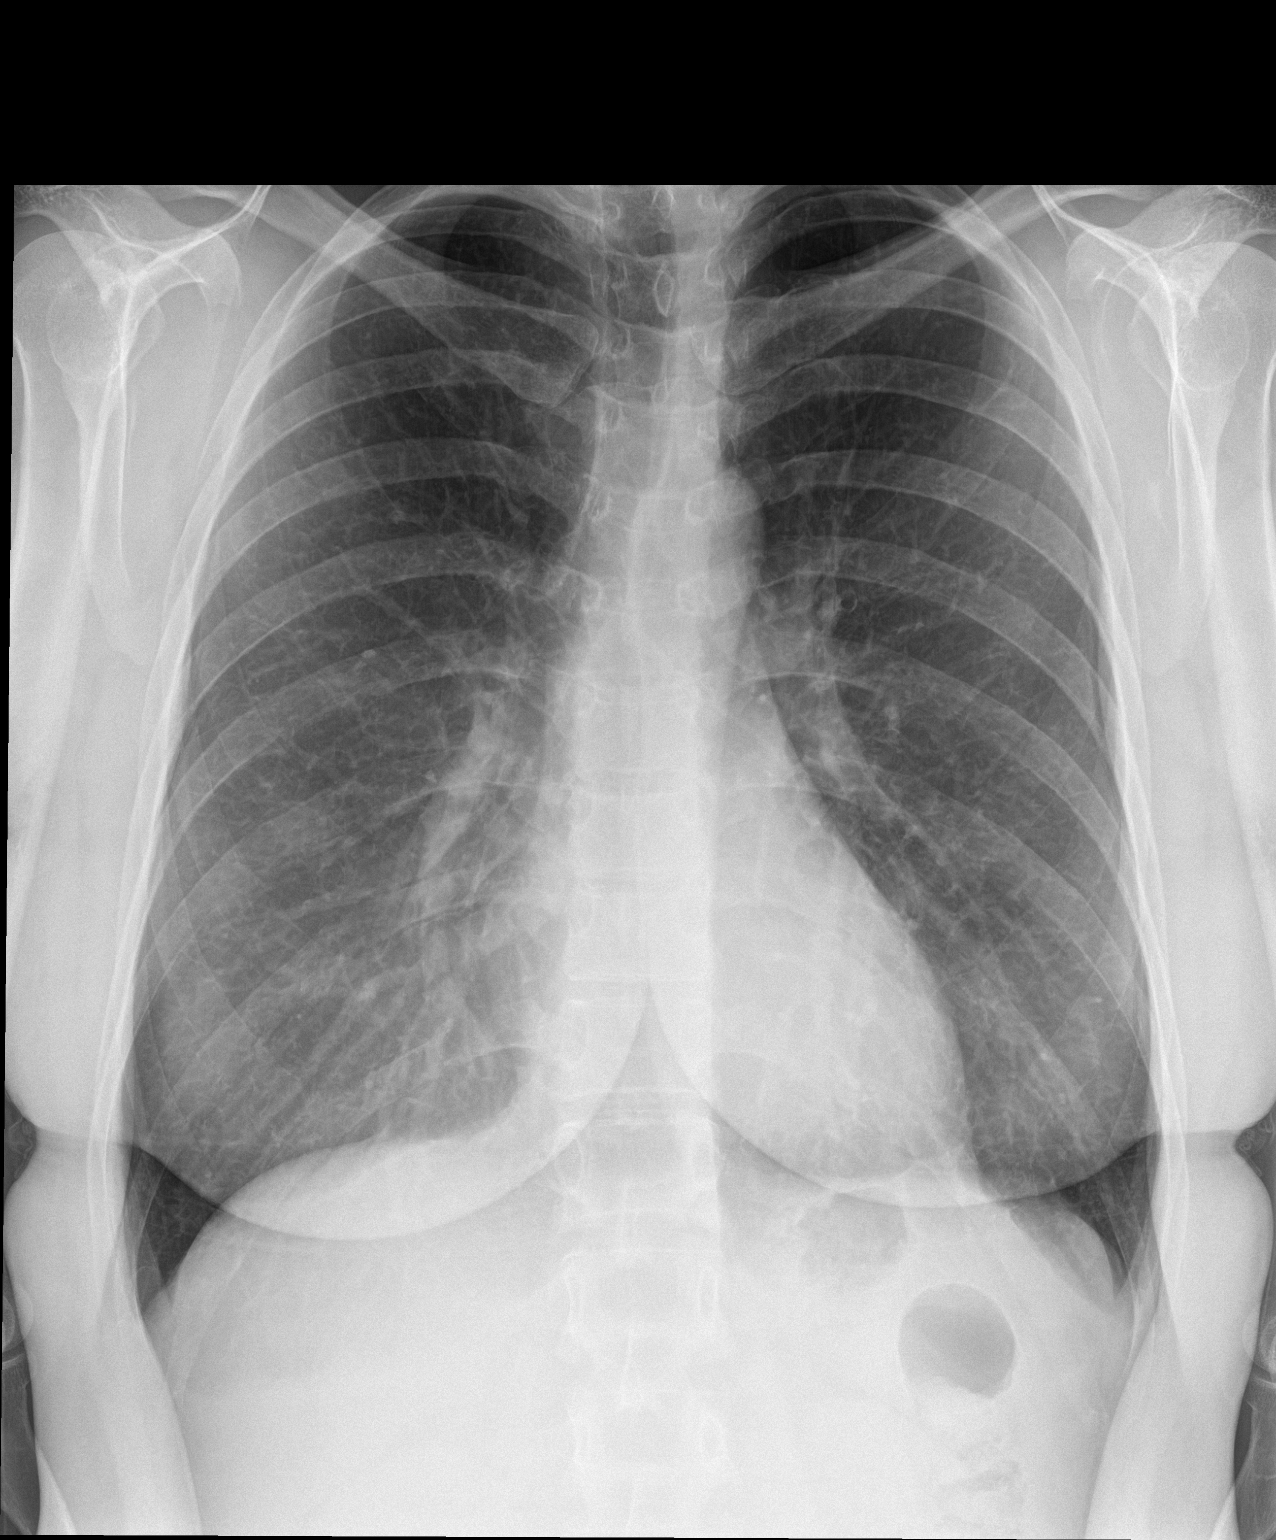

[chest lat]
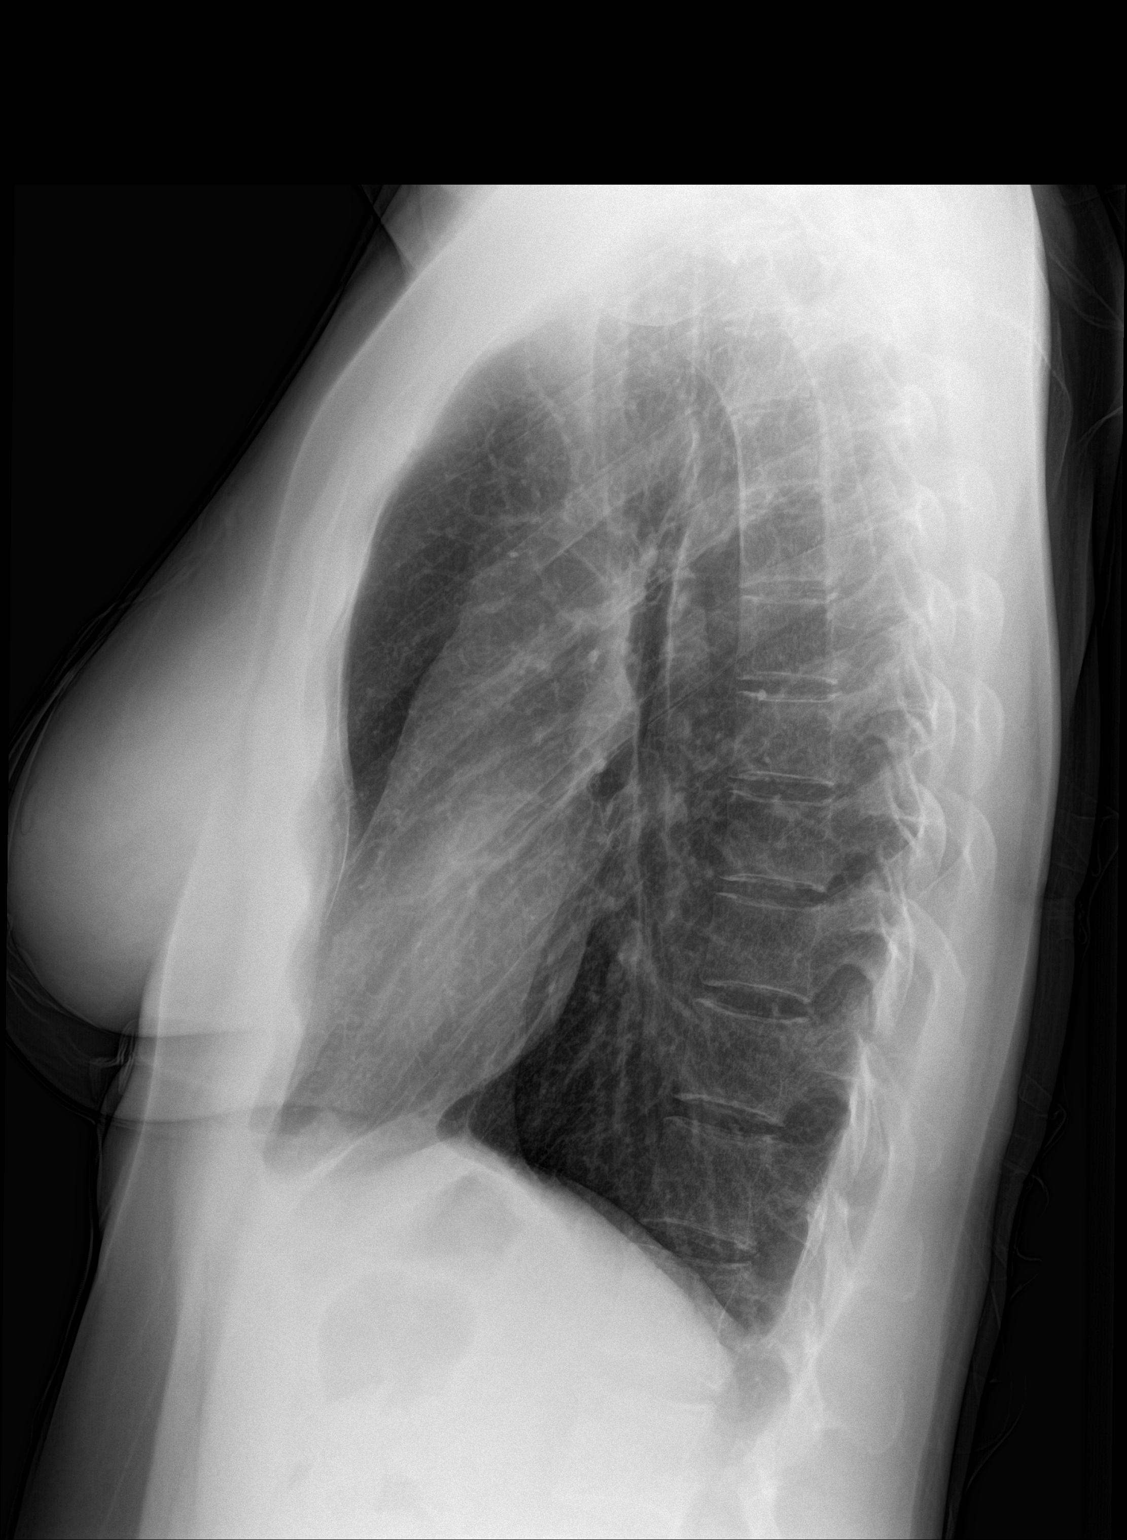

[2 of 2 positions shown; findings below may reference images not displayed]

FINDINGS: Lungs now appear clear. No evidence of pneumonia. No pleural
effusions seen. No pneumothorax.

Heart size is normal. Overall cardiomediastinal silhouette remains
normal in size and configuration. Osseous and soft tissue structures
about the chest are unremarkable.
IMPRESSION: Normal chest x-ray. Lungs now clear. No evidence of pneumonia on
today's exam.

## 2016-11-28 ENCOUNTER — Other Ambulatory Visit: Payer: Self-pay

## 2016-11-28 NOTE — Telephone Encounter (Signed)
If spine center is managing or changing therapy of her Lyrica, pt should contact them for refills since they are now managing this condition. Otherwise she would need to follow up here and there really Is no need for her to also follow here, since they took over management.

## 2016-11-28 NOTE — Telephone Encounter (Signed)
Patient notified and will call Spine specialty office.

## 2017-05-03 ENCOUNTER — Ambulatory Visit: Payer: BC Managed Care – PPO | Admitting: Family Medicine

## 2017-05-03 ENCOUNTER — Encounter: Payer: Self-pay | Admitting: Family Medicine

## 2017-05-03 VITALS — BP 112/77 | HR 77 | Temp 97.9°F | Resp 20 | Ht 68.0 in | Wt 158.0 lb

## 2017-05-03 DIAGNOSIS — R6883 Chills (without fever): Secondary | ICD-10-CM

## 2017-05-03 DIAGNOSIS — F418 Other specified anxiety disorders: Secondary | ICD-10-CM | POA: Diagnosis not present

## 2017-05-03 DIAGNOSIS — R5383 Other fatigue: Secondary | ICD-10-CM

## 2017-05-03 LAB — TSH: TSH: 0.6 u[IU]/mL (ref 0.35–4.50)

## 2017-05-03 LAB — T3, FREE: T3 FREE: 3.6 pg/mL (ref 2.3–4.2)

## 2017-05-03 LAB — CBC
HCT: 41.6 % (ref 36.0–46.0)
HEMOGLOBIN: 14.2 g/dL (ref 12.0–15.0)
MCHC: 34.1 g/dL (ref 30.0–36.0)
MCV: 92.7 fl (ref 78.0–100.0)
PLATELETS: 229 10*3/uL (ref 150.0–400.0)
RBC: 4.49 Mil/uL (ref 3.87–5.11)
RDW: 12.4 % (ref 11.5–15.5)
WBC: 6.6 10*3/uL (ref 4.0–10.5)

## 2017-05-03 LAB — T4, FREE: Free T4: 0.94 ng/dL (ref 0.60–1.60)

## 2017-05-03 MED ORDER — ESCITALOPRAM OXALATE 10 MG PO TABS: 10.0000 mg | ORAL_TABLET | Freq: Every day | ORAL | 0 refills | Status: DC

## 2017-05-03 NOTE — Progress Notes (Signed)
Stacy Bush , [birth year], 47 y.o., female MRN: [id number] Patient Care Team    Relationship Specialty Notifications Start End  Natalia Leatherwood, DO PCP - General Family Medicine  12/18/14     Chief Complaint  Patient presents with  . Depression  . Anxiety  . Fatigue    chills     Subjective: Pt presents for an OV with complaints of increased feelings of feeling overwhelmed and down of 6 months duration with escalating symptoms over last 2 weeks. She is with her husband today and he contributes to the history. She decided to come in to the office today because she has becoming more emotional and  over the last 2-3 weeks, becoming tearful at work, which created more stress. She also wants to make sure there is no "other" reason why this could be happening to her, such as thyroid problems. She does not have a history of thyroid disease. Associated symptoms include feeling overwhelmed, feeling bad about herself, feeling down and little interest in doing things she used to like to do. She endorses several days of feeling she would be "better off dead." She states she is not suicidal or have a plan to commit suicide, but it would not be a horrible idea if it happened naturally, such as "car ran off the road".  She states those feelings are not often at all, but have happened over the last few months. She endorses feeling on edge and overwhelmed. Over the last 6 months she endorses triggers of building a home, moving into the home, preparing the home, changes in job duties. She teaches a church group as well, and that is normally joyful for her, except for now there is a person attending that makes that duty difficult to manage. She has made an appt with a therapist Adelene Amas, [place]). She reports a similar episode of depression in 2002 with a similar circumstances. 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 She has been on zoloft and prozac in the past short term and did feel better while on the medications, but  when she try to come off medications she battled depression feelings again and felt worse. She reports the only thing that makes her feel at peace currently is reading her bible with a glass of tea.  Depression screen [facility] 2/9 05/03/2017 12/18/2014  Decreased Interest 3 0  Down, Depressed, Hopeless 3 0  PHQ - 2 Score 6 0  Altered sleeping 1 -  Tired, decreased energy 2 -  Change in appetite 2 -  Feeling bad or failure about yourself  3 -  Trouble concentrating 1 -  Moving slowly or fidgety/restless 1 -  Suicidal thoughts 1 -  PHQ-9 Score 17 -   GAD 7 : Generalized Anxiety Score 05/03/2017  Nervous, Anxious, on Edge 3  Control/stop worrying 2  Worry too much - different things 2  Trouble relaxing 1  Restless 0  Easily annoyed or irritable 3  Afraid - awful might happen 2  Total GAD 7 Score 13  Anxiety Difficulty Very difficult    No Known Allergies Social History   Tobacco Use  . Smoking status: Never Smoker  . Smokeless tobacco: Never Used  Substance Use Topics  . Alcohol use: No    Alcohol/week: 0.0 oz   Past Medical History:  Diagnosis Date  . Allergy   . Anemia   . Anxiety   . Depression   . Urinary incontinence    pt states with excercise  only.    Past Surgical History:  Procedure Laterality Date  . WISDOM TOOTH EXTRACTION     Family History  Problem Relation Age of Onset  . Hearing loss Mother   . COPD Father   . Heart disease Father   . Drug abuse Brother   . Alzheimer's disease Maternal Grandfather   . Diabetes Paternal Grandmother   . Drug abuse Paternal Grandfather   . Lung cancer Paternal Grandfather   . Breast cancer Neg Hx   . Colon cancer Neg Hx    Allergies as of 05/03/2017   No Known Allergies     Medication List        Accurate as of 05/03/17 11:39 AM. Always use your most recent med list.          escitalopram 10 MG tablet Commonly known as:  LEXAPRO Take 1 tablet (10 mg total) by mouth daily.   Fish Oil 1000 MG Caps Take  1,000 mg by mouth daily.   multivitamin capsule Take 1 capsule by mouth daily.   Vitamin D3 5000 units Caps Take by mouth.       All past medical history, surgical history, allergies, family history, immunizations andmedications were updated in the EMR today and reviewed under the history and medication portions of their EMR.     ROS: Negative, with the exception of above mentioned in HPI   Objective:  BP 112/77 (BP Location: Right Arm, Patient Position: Sitting, Cuff Size: Normal)   Pulse 77   Temp 97.9 F (36.6 C)   Resp 20   Ht 5\' 8"  (1.727 m)   Wt 158 lb (71.7 kg)   SpO2 100%   BMI 24.02 kg/m  Body mass index is 24.02 kg/m. Gen: Afebrile. No acute distress. Nontoxic in appearance, well developed, well nourished.  HENT: AT. [place]. MMM Eyes:Pupils Equal Round Reactive to light, Extraocular movements intact,  Conjunctiva without redness, discharge or icterus. CV: RRR  Neuro: Normal gait. PERLA. EOMi. Alert. Oriented x3  Psych: tearful at times. Otherwise, Normal affect, dress and demeanor. Normal speech. Normal thought content and judgment   No exam data present No results found. No results found for this or any previous visit (from the past 24 hour(s)).  Assessment/Plan: CAROLY Bush is a 47 y.o. female present for OV for  Depression with anxiety New onset. Lengthy discussion with pt and her husband surrounding multiple approaches that can be considered to help her. She has admitted difficulty conveniencing herself that medications may be needed. She does not like to take any medications, at least not for extended time. I do feel multidisciplinary approach with counseling, increasing exercise and medication would be beneficial for her.  - No SI or HI ideations today or plans.  - continue therapy sessions.  - [facility] contact info and 24 hour crisis line information provided to patient today.  - Lexapro 10 mg QD start recommended. Printed for her to  think about it and start if desired. She is aware to take daily for it to be effective. Discouraged benzo use 2/2 to addictive properties and it does not treat depression.  - Increase exercise to occur daily. Reassured her this is her decision to make and if exercising more, counseling and reassurance of normal thyroid function is enough to get over these feelings, then that is great and then do not start medication, if not she has it to start. - Agreed to labs to rule out thyroid d/o. It can  certainly contribute to her symptoms   - TSH - T3, free - T4, free - F/U 3 mos med is started and doing well. F/u after 4 weeks if not improving despite course of action. If emergent care is needed crisis line information was given to her and/or report to ED.   fatigue/chills - continue vit d. Continue b12. R/o anemia/iron deficiency (she has had a h/o). R/O thyroid d/o.  - TSH - T3, free - T4, free - CBC - Iron, TIBC and Ferritin Panel   Reviewed expectations re: course of current medical issues.  Discussed self-management of symptoms.  Outlined signs and symptoms indicating need for more acute intervention.  Patient verbalized understanding and all questions were answered.  Patient received an After-Visit Summary.    Orders Placed This Encounter  Procedures  . TSH  . T3, free  . T4, free  . CBC  . Iron, TIBC and Ferritin Panel   Greater than 40 minutes spent with patient, >50% of time spent face to face counseling    Note is dictated utilizing voice recognition software. Although note has been proof read prior to signing, occasional typographical errors still can be missed. If any questions arise, please do not hesitate to call for verification.   electronically signed by:  Felix Pacinienee Kuneff, DO  [facility] [place]

## 2017-05-03 NOTE — Patient Instructions (Signed)
Think about Lexapro daily. I have provided you with a script. If you start the  Medication follow up in 3 months if doing well,. If not as controlled as desired after 4 weeks follow up then instead.    I will call you with lab results once available.    Major Depressive Disorder, Adult Major depressive disorder (MDD) is a mental health condition. MDD often makes you feel sad, hopeless, or helpless. MDD can also cause symptoms in your body. MDD can affect your:  Work.  School.  Relationships.  Other normal activities.  MDD can range from mild to very bad. It may occur once (single episode MDD). It can also occur many times (recurrent MDD). The main symptoms of MDD often include:  Feeling sad, depressed, or irritable most of the time.  Loss of interest.  MDD symptoms also include:  Sleeping too much or too little.  Eating too much or too little.  A change in your weight.  Feeling tired (fatigue) or having low energy.  Feeling worthless.  Feeling guilty.  Trouble making decisions.  Trouble thinking clearly.  Thoughts of suicide or harming others.  Feeling weak.  Feeling agitated.  Keeping yourself from being around other people (isolation).  Follow these instructions at home: Activity  Do these things as told by your doctor: ? Go back to your normal activities. ? Exercise regularly. ? Spend time outdoors. Alcohol  Talk with your doctor about how alcohol can affect your antidepressant medicines.  Do not drink alcohol. Or, limit how much alcohol you drink. ? This means no more than 1 drink a day for nonpregnant women and 2 drinks a day for men. One drink equals one of these:  12 oz of beer.  5 oz of wine.  1 oz of hard liquor. General instructions  Take over-the-counter and prescription medicines only as told by your doctor.  Eat a healthy diet.  Get plenty of sleep.  Find activities that you enjoy. Make time to do them.  Think about joining  a support group. Your doctor may be able to suggest a group for you.  Keep all follow-up visits as told by your doctor. This is important. Where to find more information:  The [employer] on Mental Illness: ? www.nami.org  U.[employer] of Mental Health: ? http://www.maynard.net/www.nimh.nih.gov  National Suicide Prevention Lifeline: ? 856-294-62541-732-010-9027. This is free, 24-hour help. Contact a doctor if:  Your symptoms get worse.  You have new symptoms. Get help right away if:  You self-harm.  You see, hear, taste, smell, or feel things that are not present (hallucinate). If you ever feel like you may hurt yourself or others, or have thoughts about taking your own life, get help right away. You can go to your nearest emergency department or call:  Your local emergency services (911 in the [place]  A suicide crisis helpline, such as the National Suicide Prevention Lifeline: ? 270-132-90891-732-010-9027. This is open 24 hours a day.  This information is not intended to replace advice given to you by your health care provider. Make sure you discuss any questions you have with your health care provider. Document Released: 01/04/2015 Document Revised: 10/10/2015 Document Reviewed: 10/10/2015 Elsevier Interactive Patient Education  2017 [employer]

## 2017-05-04 ENCOUNTER — Ambulatory Visit: Payer: BC Managed Care – PPO | Admitting: Family Medicine

## 2017-05-04 LAB — IRON,TIBC AND FERRITIN PANEL
%SAT: 47 % (calc) (ref 11–50)
FERRITIN: 29 ng/mL (ref 10–232)
Iron: 172 ug/dL (ref 40–190)
TIBC: 366 mcg/dL (calc) (ref 250–450)

## 2017-06-07 LAB — HM PAP SMEAR: HM Pap smear: NORMAL

## 2017-06-12 ENCOUNTER — Encounter: Payer: Self-pay | Admitting: *Deleted

## 2017-06-19 ENCOUNTER — Other Ambulatory Visit: Payer: Self-pay | Admitting: Obstetrics and Gynecology

## 2017-06-19 DIAGNOSIS — R928 Other abnormal and inconclusive findings on diagnostic imaging of breast: Secondary | ICD-10-CM

## 2017-06-26 ENCOUNTER — Ambulatory Visit: Payer: BC Managed Care – PPO

## 2017-06-26 ENCOUNTER — Ambulatory Visit
Admission: RE | Admit: 2017-06-26 | Discharge: 2017-06-26 | Disposition: A | Discharge status: Home / Self Care | Payer: BC Managed Care – PPO | Source: Ambulatory Visit | Attending: Obstetrics and Gynecology | Admitting: Obstetrics and Gynecology

## 2017-06-26 DIAGNOSIS — R928 Other abnormal and inconclusive findings on diagnostic imaging of breast: Secondary | ICD-10-CM

## 2017-07-03 ENCOUNTER — Encounter: Payer: Self-pay | Admitting: Family Medicine

## 2018-01-04 ENCOUNTER — Telehealth: Payer: Self-pay | Admitting: Family Medicine

## 2018-01-04 NOTE — Telephone Encounter (Signed)
Received medical records from [facility] - forwarding interoffice mail to Dr[facility] Claib[facility] 01/04/18  LM

## 2018-01-07 ENCOUNTER — Encounter: Payer: Self-pay | Admitting: Family Medicine

## 2018-01-07 ENCOUNTER — Ambulatory Visit: Payer: BC Managed Care – PPO | Admitting: Family Medicine

## 2018-01-07 VITALS — BP 115/77 | HR 86 | Temp 98.0°F | Resp 16 | Ht 68.0 in | Wt 156.0 lb

## 2018-01-07 DIAGNOSIS — A0472 Enterocolitis due to Clostridium difficile, not specified as recurrent: Secondary | ICD-10-CM

## 2018-01-07 MED ORDER — ALIGN 4 MG PO CAPS: ORAL_CAPSULE | ORAL | 11 refills | Status: DC

## 2018-01-07 NOTE — Patient Instructions (Signed)
Start probiotic- continue activa.  DISINFECT the bathroom etc.  I am glad you are doing better.    Clostridium Difficile Infection Clostridium difficile (C. difficile or C. diff) infection causes inflammation of the large intestine (colon). This condition can result in damage to the lining of your colon and may lead to another condition called colitis. This infection can be passed from person to person (is contagious). Follow these instructions at home: Eating and drinking  Drink enough fluid to keep your pee (urine) clear or pale yellow.  Avoid drinking: ? Milk. ? Caffeine. ? Alcohol.  Follow exact instructions from your doctor about how to get enough fluid in your body (rehydrate).  Eat small meals often instead of large meals. Medicines  Take your antibiotic medicine as told by your doctor. Do not stop taking the antibiotic even if you start to feel better unless your doctor told you to do that.  Take over-the-counter and prescription medicines only as told by your doctor.  Do not use medicines to help with watery poop (diarrhea). General instructions  Wash your hands fully before you prepare food and after you use the bathroom. Make sure people who live with you also wash their  hands often.  Clean the surfaces that you touch. Use a product that contains chlorine bleach.  Keep all follow-up visits as told by your doctor. This is important. Contact a doctor if:  Your symptoms do not get better with treatment.  Your symptoms get worse with treatment.  Your symptoms go away and then come back.  You have a fever.  You have new symptoms. Get help right away if:  You have more pain or tenderness in your belly (abdomen).  Your poop (stool) is mostly bloody.  Your poop looks dark black and tarry.  You cannot eat or drink without throwing up (vomiting).  You have signs of dehydration, such as: ? Dark pee, very little pee, or no pee. ? Cracked lips. ? Not making  tears when you cry. ? Dry mouth. ? Sunken eyes. ? Feeling sleepy. ? Feeling weak. ? Feeling dizzy. This information is not intended to replace advice given to you by your health care provider. Make sure you discuss any questions you have with your health care provider. Document Released: 11/20/2008 Document Revised: 07/01/2015 Document Reviewed: 07/27/2014 Elsevier Interactive Patient Education  2017 [employer]

## 2018-01-07 NOTE — Progress Notes (Signed)
Stacy MuldersJill S Bush , 07/24/1970, 47 y.o., female MRN: [id number] Patient Care Team    Relationship Specialty Notifications Start End  Stacy LeatherwoodKuneff, Renee A, DO PCP - General Family Medicine  12/18/14     Chief Complaint  Patient presents with  . Follow-up    C. diff     Subjective: Pt presents for an OV with complaints of recent C.diff infection that is improving on Flagyl TID regimen. She was started on amox and clindamycin for Bush bad tooth infection [facility] and then her dentist after Bush root canal continued to be infected. She started having cramping, fever, diarrhea that started around 12/26/2017. [facility] C.diff test was completed with reported positive result per pt. She was started on flagyl TID and reports her BMs are back to normal. She is tolerating PO.  [facility] 05/03/2017 12/18/2014  Decreased Interest 3 0  Down, Depressed, Hopeless 3 0  PHQ - 2 Score 6 0  Altered sleeping 1 -  Tired, decreased energy 2 -  Change in appetite 2 -  Feeling bad or failure about yourself  3 -  Trouble concentrating 1 -  Moving slowly or fidgety/restless 1 -  Suicidal thoughts 1 -  PHQ-9 Score 17 -    No Known Allergies Social History   Tobacco Use  . Smoking status: Never Smoker  . Smokeless tobacco: Never Used  Substance Use Topics  . Alcohol use: No    Alcohol/week: 0.0 standard drinks   Past Medical History:  Diagnosis Date  . Allergy   . Anemia   . Anxiety   . Depression   . Urinary incontinence    pt states with excercise only.    Past Surgical History:  Procedure Laterality Date  . WISDOM TOOTH EXTRACTION     Family History  Problem Relation Age of Onset  . Hearing loss Mother   . COPD Father   . Heart disease Father   . Drug abuse Brother   . Alzheimer's disease Maternal Grandfather   . Diabetes Paternal Grandmother   . Drug abuse Paternal Grandfather   . Lung cancer Paternal Grandfather   . Breast cancer Neg Hx   . Colon  cancer Neg Hx    Allergies as of 01/07/2018   No Known Allergies     Medication List        Accurate as of 01/07/18  2:48 PM. Always use your most recent med list.          escitalopram 10 MG tablet Commonly known as:  LEXAPRO Take 1 tablet (10 mg total) by mouth daily.   metroNIDAZOLE 500 MG tablet Commonly known as:  FLAGYL Take 500 mg by mouth 3 (three) times daily.       All past medical history, surgical history, allergies, family history, immunizations andmedications were updated in the EMR today and reviewed under the history and medication portions of their EMR.     ROS: Negative, with the exception of above mentioned in HPI   Objective:  BP 115/77 (BP Location: Left Arm, Patient Position: Sitting, Cuff Size: Normal)   Pulse 86   Temp 98 F (36.7 C)   Resp 16   Ht 5\' 8"  (1.727 m)   Wt 156 lb (70.8 kg)   SpO2 99%   BMI 23.72 kg/m  Body mass index is 23.72 kg/m. Gen: Afebrile. No acute distress. Nontoxic in appearance, well developed, well nourished.  HENT: AT. [place].  Bilateral  MMM Eyes:Pupils Equal Round Reactive to light, Extraocular movements intact,  Conjunctiva without redness, discharge or icterus. Neck/lymp/endocrine: Supple,no lymphadenopathy CV: RRR no murmur, no edema Chest: CTAB, no wheeze or crackles. Good air movement, normal resp effort.  Abd: Soft. flat. NTND. BS presetn. no Masses palpated. No rebound or guarding.  Skin: no rashes, purpura or petechiae.  Neuro:  Normal gait. PERLA. EOMi. Alert. Oriented x3  No exam data present No results found. No results found for this or any previous visit (from the past 24 hour(s)).  Assessment/Plan: Stacy Bush is Bush 47 y.o. female present for OV for  C. difficile diarrhea - resolving. C. Diff after clindamycin abx provided by dentistry for persistent root canal tooth infection.  - Continue flagyl until full 14 days treatment.  - start probiotic.  - Probiotic Product (ALIGN) 4 MG CAPS; 1 tab Bush day.   Dispense: 30 capsule; Refill: 11 - f/u PRN   Reviewed expectations re: course of current medical issues.  Discussed self-management of symptoms.  Outlined signs and symptoms indicating need for more acute intervention.  Patient verbalized understanding and all questions were answered.  Patient received an After-Visit Summary.    No orders of the defined types were placed in this encounter.    Note is dictated utilizing voice recognition software. Although note has been proof read prior to signing, occasional typographical errors still can be missed. If any questions arise, please do not hesitate to call for verification.   electronically signed by:  Stacy Pacini, DO  [facility] - OR

## 2018-01-08 ENCOUNTER — Telehealth: Payer: Self-pay

## 2018-01-08 NOTE — Telephone Encounter (Signed)
Done per Dr. Claiborne BillingsKuneff, Note rewritten.

## 2018-01-08 NOTE — Telephone Encounter (Signed)
Copied from CRM [phone]#[id number]. Topic: General - Other >> Jan 08, 2018 10:50 AM Jaquita Rectoravis, Karen A wrote: Reason for CRM: Patient called to say that she saw Dr Claiborne BillingsKuneff on 01/07/18 note was given but date ranges are wrong Should be from 12/27/17 thru 01/07/18. Please advise patient is awaiting phone call. Ph# [phone]

## 2018-06-20 LAB — HM MAMMOGRAPHY

## 2018-06-20 LAB — HM PAP SMEAR

## 2018-06-26 ENCOUNTER — Encounter: Payer: Self-pay | Admitting: Family Medicine

## 2018-08-16 ENCOUNTER — Encounter (HOSPITAL_BASED_OUTPATIENT_CLINIC_OR_DEPARTMENT_OTHER): Payer: Self-pay | Admitting: *Deleted

## 2018-08-16 ENCOUNTER — Other Ambulatory Visit: Payer: Self-pay

## 2018-08-16 ENCOUNTER — Emergency Department (HOSPITAL_BASED_OUTPATIENT_CLINIC_OR_DEPARTMENT_OTHER): Payer: BC Managed Care – PPO

## 2018-08-16 ENCOUNTER — Emergency Department (HOSPITAL_BASED_OUTPATIENT_CLINIC_OR_DEPARTMENT_OTHER)
Admission: EM | Admit: 2018-08-16 | Discharge: 2018-08-16 | Disposition: A | Discharge status: Home / Self Care | Payer: BC Managed Care – PPO | Attending: Emergency Medicine | Admitting: Emergency Medicine

## 2018-08-16 DIAGNOSIS — R1030 Lower abdominal pain, unspecified: Secondary | ICD-10-CM | POA: Insufficient documentation

## 2018-08-16 DIAGNOSIS — Z79899 Other long term (current) drug therapy: Secondary | ICD-10-CM | POA: Diagnosis not present

## 2018-08-16 LAB — COMPREHENSIVE METABOLIC PANEL
ALT: 14 U/L (ref 0–44)
AST: 17 U/L (ref 15–41)
Albumin: 4.3 g/dL (ref 3.5–5.0)
Alkaline Phosphatase: 31 U/L — ABNORMAL LOW (ref 38–126)
Anion gap: 7 (ref 5–15)
BUN: 10 mg/dL (ref 6–20)
CO2: 25 mmol/L (ref 22–32)
Calcium: 9 mg/dL (ref 8.9–10.3)
Chloride: 105 mmol/L (ref 98–111)
Creatinine, Ser: 0.54 mg/dL (ref 0.44–1.00)
GFR calc Af Amer: >60 mL/min (ref >60)
GFR calc non Af Amer: >60 mL/min (ref >60)
Glucose, Bld: 110 mg/dL — ABNORMAL HIGH (ref 70–99)
Potassium: 3.8 mmol/L (ref 3.5–5.1)
Sodium: 137 mmol/L (ref 135–145)
Total Bilirubin: 0.5 mg/dL (ref 0.3–1.2)
Total Protein: 7.6 g/dL (ref 6.5–8.1)

## 2018-08-16 LAB — URINALYSIS, ROUTINE W REFLEX MICROSCOPIC
Bilirubin Urine: NEGATIVE
Glucose, UA: NEGATIVE mg/dL
Ketones, ur: 15 mg/dL — AB
Nitrite: NEGATIVE
Protein, ur: 30 mg/dL — AB
Specific Gravity, Urine: >1.03 — ABNORMAL HIGH (ref 1.005–1.030)
pH: 7 (ref 5.0–8.0)

## 2018-08-16 LAB — URINALYSIS, MICROSCOPIC (REFLEX): RBC / HPF: >50 RBC/hpf (ref 0–5)

## 2018-08-16 LAB — CBC
HCT: 40.3 % (ref 36.0–46.0)
Hemoglobin: 13.1 g/dL (ref 12.0–15.0)
MCH: 30.4 pg (ref 26.0–34.0)
MCHC: 32.5 g/dL (ref 30.0–36.0)
MCV: 93.5 fL (ref 80.0–100.0)
Platelets: 257 10*3/uL (ref 150–400)
RBC: 4.31 MIL/uL (ref 3.87–5.11)
RDW: 11.9 % (ref 11.5–15.5)
WBC: 13.7 10*3/uL — ABNORMAL HIGH (ref 4.0–10.5)
nRBC: 0 % (ref 0.0–0.2)

## 2018-08-16 LAB — LIPASE, BLOOD: Lipase: 35 U/L (ref 11–51)

## 2018-08-16 LAB — PREGNANCY, URINE: Preg Test, Ur: NEGATIVE

## 2018-08-16 MED ORDER — SODIUM CHLORIDE 0.9% FLUSH
3.0000 mL | Freq: Once | INTRAVENOUS | Status: DC
  Filled 2018-08-16: qty 3

## 2018-08-16 MED ORDER — IOHEXOL 300 MG/ML  SOLN
100.0000 mL | Freq: Once | INTRAMUSCULAR | Status: AC | PRN
  Administered 2018-08-16: 100 mL via INTRAVENOUS

## 2018-08-16 NOTE — ED Notes (Signed)
Patient transported to CT 

## 2018-08-16 NOTE — ED Notes (Signed)
Pt returned from CT °

## 2018-08-16 NOTE — ED Notes (Signed)
ED Provider at bedside. 

## 2018-08-16 NOTE — ED Provider Notes (Signed)
[facility] EMERGENCY DEPARTMENT Provider Note   CSN: [id number] Arrival date & time: 08/16/18  1313     History   Chief Complaint Chief Complaint  Patient presents with   Abdominal Pain    HPI Stacy Bush is a 48 y.o. female.  She has no significant past medical history.  She is complaining of severe low abdominal pain that started last evening 9 out of 10 intensity crampy colicky that lasted for multiple hours and improved after she took some ibuprofen.  She said the pain recurred today again even more intense and she took some Aleve and it did not seem to help as much.  It was not associated with any nausea vomiting diarrhea or urinary symptoms.  She is on day 3 of her menstrual cycle which is occurring at a normal time and she does not usually have any kind of cramps other than on the first day.  No increase in vaginal bleeding.  No discharge.  No prior surgical history.  No fevers or chills.     The history is provided by the patient.  Abdominal Pain Pain location:  Suprapubic, LLQ and RLQ Pain quality: aching and cramping   Pain radiates to:  Does not radiate Pain severity:  Severe Onset quality:  Sudden Duration:  18 hours Timing:  Intermittent Progression:  Partially resolved Chronicity:  New Context: not previous surgeries, not recent illness, not recent travel, not sick contacts and not trauma   Relieved by:  Nothing Worsened by:  Nothing Ineffective treatments:  NSAIDs Associated symptoms: constipation (chronic) and vaginal bleeding (menses)   Associated symptoms: no chest pain, no chills, no cough, no dysuria, no fever, no hematemesis, no nausea, no shortness of breath, no sore throat and no vaginal discharge     Past Medical History:  Diagnosis Date   Allergy    Anemia    Anxiety    Depression    Urinary incontinence    pt states with excercise only.     Patient Active Problem List   Diagnosis Date Noted   Cervical radiculopathy  10/04/2016   Muscle spasm 09/15/2016   Neck pain 09/15/2016   Maxillary sinusitis 03/04/2015   BPPV (benign paroxysmal positional vertigo) 03/04/2015   Pleuritic chest pain 02/22/2015   Constipation 12/18/2014   Palpitations 12/18/2014   Encounter to establish care 12/18/2014   Health care maintenance 12/18/2014   Vegetarian diet 12/18/2014   Abnormal TSH 12/18/2014    Past Surgical History:  Procedure Laterality Date   WISDOM TOOTH EXTRACTION       OB History   No obstetric history on file.      Home Medications    Prior to Admission medications   Medication Sig Start Date End Date Taking? Authorizing Provider  escitalopram (LEXAPRO) 10 MG tablet Take 1 tablet (10 mg total) by mouth daily. 05/03/17  Yes Kuneff, Renee A, DO  metroNIDAZOLE (FLAGYL) 500 MG tablet Take 500 mg by mouth 3 (three) times daily.    [provider]  Probiotic Product (ALIGN) 4 MG CAPS 1 tab a day. 01/07/18   Ma Hillock, DO    Family History Family History  Problem Relation Age of Onset   Hearing loss Mother    COPD Father    Heart disease Father    Drug abuse Brother    Alzheimer's disease Maternal Grandfather    Diabetes Paternal Grandmother    Drug abuse Paternal Grandfather    Lung cancer Paternal Grandfather  Breast cancer Neg Hx    Colon cancer Neg Hx     Social History Social History   Tobacco Use   Smoking status: Never Smoker   Smokeless tobacco: Never Used  Substance Use Topics   Alcohol use: No    Alcohol/week: 0.0 standard drinks   Drug use: No     Allergies   Patient has no known allergies.   Review of Systems Review of Systems  Constitutional: Negative for chills and fever.  HENT: Negative for sore throat.   Eyes: Negative for visual disturbance.  Respiratory: Negative for cough and shortness of breath.   Cardiovascular: Negative for chest pain.  Gastrointestinal: Positive for abdominal pain and constipation  (chronic). Negative for hematemesis and nausea.  Genitourinary: Positive for vaginal bleeding (menses). Negative for dysuria and vaginal discharge.  Musculoskeletal: Negative for back pain.  Skin: Negative for rash.  Neurological: Negative for headaches.     Physical Exam Updated Vital Signs BP 120/70 (BP Location: Right Arm)    Pulse 75    Temp 98.5 F (36.9 C) (Oral)    Resp 16    Ht 5\' 8"  (1.727 m)    Wt 75.8 kg    LMP 08/13/2018    SpO2 99%    BMI 25.39 kg/m   Physical Exam Vitals signs and nursing note reviewed.  Constitutional:      General: She is not in acute distress.    Appearance: She is well-developed.  HENT:     Head: Normocephalic and atraumatic.  Eyes:     Conjunctiva/sclera: Conjunctivae normal.  Neck:     Musculoskeletal: Neck supple.  Cardiovascular:     Rate and Rhythm: Normal rate and regular rhythm.     Heart sounds: No murmur.  Pulmonary:     Effort: Pulmonary effort is normal. No respiratory distress.     Breath sounds: Normal breath sounds.  Abdominal:     Palpations: Abdomen is soft.     Tenderness: There is abdominal tenderness in the right lower quadrant, suprapubic area and left lower quadrant. There is no guarding or rebound.  Musculoskeletal: Normal range of motion.     Right lower leg: No edema.     Left lower leg: No edema.  Skin:    General: Skin is warm and dry.     Capillary Refill: Capillary refill takes less than 2 seconds.  Neurological:     General: No focal deficit present.     Mental Status: She is alert and oriented to person, place, and time.      ED Treatments / Results  Labs (all labs ordered are listed, but only abnormal results are displayed) Labs Reviewed  COMPREHENSIVE METABOLIC PANEL - Abnormal; Notable for the following components:      Result Value   Glucose, Bld 110 (*)    Alkaline Phosphatase 31 (*)    All other components within normal limits  CBC - Abnormal; Notable for the following components:   WBC  13.7 (*)    All other components within normal limits  URINALYSIS, ROUTINE W REFLEX MICROSCOPIC - Abnormal; Notable for the following components:   Color, Urine PINK (*)    APPearance CLOUDY (*)    Specific Gravity, Urine >1.030 (*)    Hgb urine dipstick LARGE (*)    Ketones, ur 15 (*)    Protein, ur 30 (*)    Leukocytes,Ua TRACE (*)    All other components within normal limits  URINALYSIS, MICROSCOPIC (REFLEX) - Abnormal; Notable  for the following components:   Bacteria, UA MANY (*)    All other components within normal limits  LIPASE, BLOOD  PREGNANCY, URINE    EKG None  Radiology Ct Abdomen Pelvis W Contrast  Result Date: 08/16/2018 CLINICAL DATA:  Abdominal pain with diverticulitis suspected EXAM: CT ABDOMEN AND PELVIS WITH CONTRAST TECHNIQUE: Multidetector CT imaging of the abdomen and pelvis was performed using the standard protocol following bolus administration of intravenous contrast. CONTRAST:  100mL OMNIPAQUE IOHEXOL 300 MG/ML  SOLN COMPARISON:  None. FINDINGS: Lower chest: The lung bases are clear. The heart size is normal. Hepatobiliary: The liver is normal. Normal gallbladder.There is no biliary ductal dilation. Pancreas: Normal contours without ductal dilatation. No peripancreatic fluid collection. Spleen: No splenic laceration or hematoma. Adrenals/Urinary Tract: --Adrenal glands: No adrenal hemorrhage. --Right kidney/ureter: No hydronephrosis or perinephric hematoma. --Left kidney/ureter: No hydronephrosis or perinephric hematoma. --Urinary bladder: Unremarkable. Stomach/Bowel: --Stomach/Duodenum: No hiatal hernia or other gastric abnormality. Normal duodenal course and caliber. --Small bowel: No dilatation or inflammation. --Colon: No focal abnormality. --Appendix: The appendix is not reliably identified. It is likely adjacent to the right ovary, but is not clearly visualized. Vascular/Lymphatic: Normal course and caliber of the major abdominal vessels. --No retroperitoneal  lymphadenopathy. --No mesenteric lymphadenopathy. --No pelvic or inguinal lymphadenopathy. Reproductive: Unremarkable Other: There is a small volume of pelvic free fluid which is likely physiologic. No free air. The abdominal wall is normal. Musculoskeletal. No acute displaced fractures. IMPRESSION: 1. No definite acute intra-abdominal abnormality detected. 2. Small amount of free fluid in the pelvis, likely physiologic. 3. The appendix is not reliably identified on this exam. 4. No CT evidence for diverticulitis.  No small bowel obstruction. Electronically Signed   By: Katherine Mantlehristopher  Green M.D.   On: 08/16/2018 17:21    Procedures Procedures (including critical care time)  Medications Ordered in ED Medications  iohexol (OMNIPAQUE) 300 MG/ML solution 100 mL (100 mLs Intravenous Contrast Given 08/16/18 1700)     Initial Impression / Assessment and Plan / ED Course  I have reviewed the triage vital signs and the nursing notes.  Pertinent labs & imaging results that were available during my care of the patient were reviewed by me and considered in my medical decision making (see chart for details).  Clinical Course as of Aug 17 850  Fri Aug 16, 2018  [id number] 48 year old female here with 2 episodes of severe low abdominal pain in the setting of having her period a few days ago.  Differential includes diverticulitis, renal colic, dysmenorrhea, ovarian torsion, endometriosis   [MB]  1751 Patient CT does not show any significant findings.  Her UA looks dirty but she is having her menses.  Slight elevation in white count at 13.  Reviewed this with the patient and she is comfortable going home and will continue NSAIDs and follow-up with her GYN.   [MB]    Clinical Course User Index [MB] Terrilee FilesButler, Meredeth Furber C, MD        Final Clinical Impressions(s) / ED Diagnoses   Final diagnoses:  Lower abdominal pain    ED Discharge Orders    None       Terrilee FilesButler, Sherlin Sonier C, MD 08/17/18 [phone]

## 2018-08-16 NOTE — Discharge Instructions (Addendum)
You were seen in the emergency department for lower abdominal pain.  You had blood work urinalysis and a CAT scan of your abdomen and pelvis that did not show any serious findings.  This is possibly related to your menstrual cycle.  Should continue anti-inflammatories twice a day and can use Tylenol in between for breakthrough pain.  Stay well-hydrated.  Follow-up with your doctor and return if any worsening symptoms.

## 2018-08-16 NOTE — ED Notes (Signed)
States pain has eased off since taking 2 Aleve PTA

## 2018-08-16 NOTE — ED Triage Notes (Signed)
Lower abdominal pain since last night. Tender to touch. Bloated. Menses now. No diarrhea, nausea or vomiting.

## 2018-12-20 ENCOUNTER — Other Ambulatory Visit: Payer: Self-pay

## 2018-12-20 ENCOUNTER — Ambulatory Visit: Payer: BC Managed Care – PPO | Admitting: Family Medicine

## 2018-12-20 ENCOUNTER — Ambulatory Visit: Payer: BC Managed Care – PPO | Admitting: Internal Medicine

## 2018-12-20 ENCOUNTER — Encounter: Payer: Self-pay | Admitting: Internal Medicine

## 2018-12-20 VITALS — BP 120/75 | HR 90 | Temp 97.8°F | Resp 12 | Ht 68.0 in | Wt 170.4 lb

## 2018-12-20 DIAGNOSIS — Z23 Encounter for immunization: Secondary | ICD-10-CM | POA: Diagnosis not present

## 2018-12-20 DIAGNOSIS — M545 Low back pain, unspecified: Secondary | ICD-10-CM

## 2018-12-20 MED ORDER — PREDNISONE 10 MG PO TABS: ORAL_TABLET | ORAL | 0 refills | Status: DC

## 2018-12-20 MED ORDER — TRAMADOL HCL 50 MG PO TABS: 50.0000 mg | ORAL_TABLET | Freq: Every evening | ORAL | 0 refills | Status: AC | PRN

## 2018-12-20 NOTE — Progress Notes (Signed)
Subjective:    Patient ID: Stacy Bush, female    DOB: April 23, 1970, 48 y.o.   MRN: [id number]  DOS:  12/20/2018 Type of visit - description: Acute The patient was working with her husband taking down a tree  4 days ago, in the process, she got hurt, as a tree was falling some limbs landed on her upper back. She fell to the ground. There was no bleeding.  No LOC. After that she developed left low back pain.  Denies any neck pain or headache. Taking ibuprofen 800 mg every 6 hours prn, using ice. The pain increased, 2 days ago went to the chiropractor, x-rays were negative, she got some massage therapy. Last night she took tramadol and for the first time she felt some relief. She is here for further advised  Review of Systems No nausea vomiting No gross hematuria No abdominal pain No actual radiation of the pain, question of a mild paresthesia and the left lateral thigh. No bladder or bowel incontinence  Past Medical History:  Diagnosis Date  . Allergy   . Anemia   . Anxiety   . Depression   . Urinary incontinence    pt states with excercise only.     Past Surgical History:  Procedure Laterality Date  . WISDOM TOOTH EXTRACTION      Social History   Socioeconomic History  . Marital status: Married    Spouse name: Not on file  . Number of children: Not on file  . Years of education: Not on file  . Highest education level: Not on file  Occupational History  . Not on file  Social Needs  . Financial resource strain: Not on file  . Food insecurity    Worry: Not on file    Inability: Not on file  . Transportation needs    Medical: Not on file    Non-medical: Not on file  Tobacco Use  . Smoking status: Never Smoker  . Smokeless tobacco: Never Used  Substance and Sexual Activity  . Alcohol use: No    Alcohol/week: 0.0 standard drinks  . Drug use: No  . Sexual activity: Yes    Comment: husband vasectomy  Lifestyle  . Physical activity    Days per week: Not on  file    Minutes per session: Not on file  . Stress: Not on file  Relationships  . Social Musician on phone: Not on file    Gets together: Not on file    Attends religious service: Not on file    Active member of club or organization: Not on file    Attends meetings of clubs or organizations: Not on file    Relationship status: Not on file  . Intimate partner violence    Fear of current or ex partner: Not on file    Emotionally abused: Not on file    Physically abused: Not on file    Forced sexual activity: Not on file  Other Topics Concern  . Not on file  Social History Narrative   Married. Spouse's name is Optometrist. They have 1 child, named Armed forces technical officer .    Museum/gallery exhibitions officer. Master's Degree.   Patient drinks caffeine, uses herbal remedies, takes a daily vitamin.   Patient wears her seatbelt, she wears a bike helmet. She exercises at least 3 times a week.   Patient does report a vegetarian diet.   There is a smoke detector in her home, she feels safe in  her relationships.      Allergies as of 12/20/2018   No Known Allergies     Medication List       Accurate as of December 20, 2018 11:25 AM. If you have any questions, ask your nurse or doctor.        STOP taking these medications   Align 4 MG Caps Stopped by: Kathlene November, MD   metroNIDAZOLE 500 MG tablet Commonly known as: FLAGYL Stopped by: Kathlene November, MD     TAKE these medications   escitalopram 20 MG tablet Commonly known as: LEXAPRO Take 20 mg by mouth daily. What changed: Another medication with the same name was removed. Continue taking this medication, and follow the directions you see here. Changed by: Kathlene November, MD           Objective:   Physical Exam Skin:        BP 120/75 (BP Location: Right Arm, Cuff Size: Normal)   Pulse 90   Temp 97.8 F (36.6 C) (Temporal)   Resp 12   Ht 5\' 8"  (1.727 m)   Wt 170 lb 6.4 oz (77.3 kg)   SpO2 100%   BMI 25.91 kg/m   General:   Well developed,  NAD, BMI noted. HEENT:  Normocephalic . Face symmetric, atraumatic  Skin: Not pale. Not jaundice MSK: No TTP at the lumbar sacral spine or SI joints Neurologic:  alert & oriented X3.  Speech normal, gait unassisted nonantalgic, posterior quite antalgic particularly when she gets down and up from the examining table. DTRs and motor symmetric Psych--  Cognition and judgment appear intact.  Cooperative with normal attention span and concentration.  Behavior appropriate. No anxious or depressed appearing.      Assessment    48 year old female, PMH includes depression, anxiety, on Lexapro, birth control husband's vasectomy presents with  Lumbar sprain: On clinical grounds is unlikely she has radiculopathy or serious injury on her back.  Fortunately denies neck pain or headaches after the accident. Plan: Prednisone Pain control with Tylenol, tramadol at bedtime and if needed ibuprofen.  Decrease dose to 400 mg and follow GI precautions. Also rest, ice/heat. Call if not better.

## 2018-12-20 NOTE — Patient Instructions (Signed)
Rest  ICE or a heating pad as needed  Prednisone as prescribed  Tylenol  500 mg OTC 2 tabs a day every 8 hours as needed for pain  Tramadol at night if needed  Also okay to take ibuprofen 200 mg 2 tablets 3-4 times a day if Tylenol / tramadol is not enough  Always take it with food because may cause gastritis and ulcers.  If you notice nausea, stomach pain, change in the color of stools --->  Stop the medicine and let us know  Call if not gradually better

## 2019-10-10 ENCOUNTER — Ambulatory Visit (INDEPENDENT_AMBULATORY_CARE_PROVIDER_SITE_OTHER): Payer: BC Managed Care – PPO | Admitting: Family Medicine

## 2019-10-10 ENCOUNTER — Other Ambulatory Visit: Payer: Self-pay

## 2019-10-10 ENCOUNTER — Encounter: Payer: Self-pay | Admitting: Family Medicine

## 2019-10-10 VITALS — BP 111/74 | HR 79 | Temp 98.8°F | Resp 16 | Ht 68.0 in | Wt 173.0 lb

## 2019-10-10 DIAGNOSIS — Z130 Encounter for screening for diseases of the blood and blood-forming organs and certain disorders involving the immune mechanism: Secondary | ICD-10-CM | POA: Diagnosis not present

## 2019-10-10 DIAGNOSIS — E559 Vitamin D deficiency, unspecified: Secondary | ICD-10-CM | POA: Diagnosis not present

## 2019-10-10 DIAGNOSIS — Z0000 Encounter for general adult medical examination without abnormal findings: Secondary | ICD-10-CM

## 2019-10-10 DIAGNOSIS — Z1159 Encounter for screening for other viral diseases: Secondary | ICD-10-CM

## 2019-10-10 DIAGNOSIS — Z13220 Encounter for screening for lipoid disorders: Secondary | ICD-10-CM

## 2019-10-10 DIAGNOSIS — R7989 Other specified abnormal findings of blood chemistry: Secondary | ICD-10-CM | POA: Diagnosis not present

## 2019-10-10 DIAGNOSIS — Z1211 Encounter for screening for malignant neoplasm of colon: Secondary | ICD-10-CM

## 2019-10-10 DIAGNOSIS — Z79899 Other long term (current) drug therapy: Secondary | ICD-10-CM | POA: Diagnosis not present

## 2019-10-10 DIAGNOSIS — Z131 Encounter for screening for diabetes mellitus: Secondary | ICD-10-CM

## 2019-10-10 LAB — COMPREHENSIVE METABOLIC PANEL
ALT: 8 U/L (ref 0–35)
AST: 12 U/L (ref 0–37)
Albumin: 4.5 g/dL (ref 3.5–5.2)
Alkaline Phosphatase: 32 U/L — ABNORMAL LOW (ref 39–117)
BUN: 8 mg/dL (ref 6–23)
CO2: 27 mEq/L (ref 19–32)
Calcium: 9.4 mg/dL (ref 8.4–10.5)
Chloride: 103 mEq/L (ref 96–112)
Creatinine, Ser: 0.64 mg/dL (ref 0.40–1.20)
GFR: 99.48 mL/min (ref >60.00)
Glucose, Bld: 95 mg/dL (ref 70–99)
Potassium: 4.3 mEq/L (ref 3.5–5.1)
Sodium: 137 mEq/L (ref 135–145)
Total Bilirubin: 0.5 mg/dL (ref 0.2–1.2)
Total Protein: 7.2 g/dL (ref 6.0–8.3)

## 2019-10-10 LAB — LIPID PANEL
Cholesterol: 156 mg/dL (ref 0–200)
HDL: 44.1 mg/dL (ref >39.00)
LDL Cholesterol: 96 mg/dL (ref 0–99)
NonHDL: 112.33
Total CHOL/HDL Ratio: 4
Triglycerides: 80 mg/dL (ref 0.0–149.0)
VLDL: 16 mg/dL (ref 0.0–40.0)

## 2019-10-10 LAB — CBC
HCT: 40.5 % (ref 36.0–46.0)
Hemoglobin: 13.5 g/dL (ref 12.0–15.0)
MCHC: 33.4 g/dL (ref 30.0–36.0)
MCV: 92.1 fl (ref 78.0–100.0)
Platelets: 279 10*3/uL (ref 150.0–400.0)
RBC: 4.4 Mil/uL (ref 3.87–5.11)
RDW: 12.4 % (ref 11.5–15.5)
WBC: 6.2 10*3/uL (ref 4.0–10.5)

## 2019-10-10 LAB — TSH: TSH: 0.74 u[IU]/mL (ref 0.35–4.50)

## 2019-10-10 LAB — VITAMIN D 25 HYDROXY (VIT D DEFICIENCY, FRACTURES): VITD: 35.11 ng/mL (ref 30.00–100.00)

## 2019-10-10 LAB — HEMOGLOBIN A1C: Hgb A1c MFr Bld: 5.7 % (ref 4.6–6.5)

## 2019-10-10 NOTE — Patient Instructions (Signed)
Health Maintenance, Female Adopting a healthy lifestyle and getting preventive care are important in promoting health and wellness. Ask your health care provider about:  The right schedule for you to have regular tests and exams.  Things you can do on your own to prevent diseases and keep yourself healthy. What should I know about diet, weight, and exercise? Eat a healthy diet   Eat a diet that includes plenty of vegetables, fruits, low-fat dairy products, and lean protein.  Do not eat a lot of foods that are high in solid fats, added sugars, or sodium. Maintain a healthy weight Body mass index (BMI) is used to identify weight problems. It estimates body fat based on height and weight. Your health care provider can help determine your BMI and help you achieve or maintain a healthy weight. Get regular exercise Get regular exercise. This is one of the most important things you can do for your health. Most adults should:  Exercise for at least 150 minutes each week. The exercise should increase your heart rate and make you sweat (moderate-intensity exercise).  Do strengthening exercises at least twice a week. This is in addition to the moderate-intensity exercise.  Spend less time sitting. Even light physical activity can be beneficial. Watch cholesterol and blood lipids Have your blood tested for lipids and cholesterol at 47 years of age, then have this test every 5 years. Have your cholesterol levels checked more often if:  Your lipid or cholesterol levels are high.  You are older than 47 years of age.  You are at high risk for heart disease. What should I know about cancer screening? Depending on your health history and family history, you may need to have cancer screening at various ages. This may include screening for:  Breast cancer.  Cervical cancer.  Colorectal cancer.  Skin cancer.  Lung cancer. What should I know about heart disease, diabetes, and high blood  pressure? Blood pressure and heart disease  High blood pressure causes heart disease and increases the risk of stroke. This is more likely to develop in people who have high blood pressure readings, are of African descent, or are overweight.  Have your blood pressure checked: ? Every 3-5 years if you are 18-39 years of age. ? Every year if you are 40 years old or older. Diabetes Have regular diabetes screenings. This checks your fasting blood sugar level. Have the screening done:  Once every three years after age 40 if you are at a normal weight and have a low risk for diabetes.  More often and at a younger age if you are overweight or have a high risk for diabetes. What should I know about preventing infection? Hepatitis B If you have a higher risk for hepatitis B, you should be screened for this virus. Talk with your health care provider to find out if you are at risk for hepatitis B infection. Hepatitis C Testing is recommended for:  Everyone born from 1945 through 1965.  Anyone with known risk factors for hepatitis C. Sexually transmitted infections (STIs)  Get screened for STIs, including gonorrhea and chlamydia, if: ? You are sexually active and are younger than 47 years of age. ? You are older than 47 years of age and your health care provider tells you that you are at risk for this type of infection. ? Your sexual activity has changed since you were last screened, and you are at increased risk for chlamydia or gonorrhea. Ask your health care provider if   you are at risk.  Ask your health care provider about whether you are at high risk for HIV. Your health care provider may recommend a prescription medicine to help prevent HIV infection. If you choose to take medicine to prevent HIV, you should first get tested for HIV. You should then be tested every 3 months for as long as you are taking the medicine. Pregnancy  If you are about to stop having your period (premenopausal) and  you may become pregnant, seek counseling before you get pregnant.  Take 400 to 800 micrograms (mcg) of folic acid every day if you become pregnant.  Ask for birth control (contraception) if you want to prevent pregnancy. Osteoporosis and menopause Osteoporosis is a disease in which the bones lose minerals and strength with aging. This can result in bone fractures. If you are 65 years old or older, or if you are at risk for osteoporosis and fractures, ask your health care provider if you should:  Be screened for bone loss.  Take a calcium or vitamin D supplement to lower your risk of fractures.  Be given hormone replacement therapy (HRT) to treat symptoms of menopause. Follow these instructions at home: Lifestyle  Do not use any products that contain nicotine or tobacco, such as cigarettes, e-cigarettes, and chewing tobacco. If you need help quitting, ask your health care provider.  Do not use street drugs.  Do not share needles.  Ask your health care provider for help if you need support or information about quitting drugs. Alcohol use  Do not drink alcohol if: ? Your health care provider tells you not to drink. ? You are pregnant, may be pregnant, or are planning to become pregnant.  If you drink alcohol: ? Limit how much you use to 0-1 drink a day. ? Limit intake if you are breastfeeding.  Be aware of how much alcohol is in your drink. In the [place] one drink equals one 12 oz bottle of beer (355 mL), one 5 oz glass of wine (148 mL), or one 1 oz glass of hard liquor (44 mL). General instructions  Schedule regular health, dental, and eye exams.  Stay current with your vaccines.  Tell your health care provider if: ? You often feel depressed. ? You have ever been abused or do not feel safe at home. Summary  Adopting a healthy lifestyle and getting preventive care are important in promoting health and wellness.  Follow your health care provider's instructions about healthy  diet, exercising, and getting tested or screened for diseases.  Follow your health care provider's instructions on monitoring your cholesterol and blood pressure. This information is not intended to replace advice given to you by your health care provider. Make sure you discuss any questions you have with your health care provider. Document Revised: 01/16/2018 Document Reviewed: 01/16/2018 Elsevier Patient Education  2020 Elsevier Inc.  

## 2019-10-10 NOTE — Progress Notes (Signed)
This visit occurred during the SARS-CoV-2 public health emergency.  Safety protocols were in place, including screening questions prior to the visit, additional usage of staff PPE, and extensive cleaning of exam room while observing appropriate contact time as indicated for disinfecting solutions.    Patient ID: Stacy Bush, female  DOB: 09/05/1970, 49 y.o.   MRN: [id number] Patient Care Team    Relationship Specialty Notifications Start End  Ma Hillock, DO PCP - General Family Medicine  12/18/14   Paula Compton, MD Consulting Physician Obstetrics and Gynecology  10/10/19     Chief Complaint  Patient presents with  . Annual Exam    fasting, no flu shot today    Subjective: Stacy Bush is a 49 y.o.  Female  present for CPE. All past medical history, surgical history, allergies, family history, immunizations, medications and social history were updated in the electronic medical record today. All recent labs, ED visits and hospitalizations within the last year were reviewed.  Health maintenance:  Colonoscopy: No FHX, Routine screen at 45> cologuard ordered Mammogram: last mammogram 06/2018- GYN  Cervical cancer screening: PAPs norma 06/2018 Immunizations: Tdap 2014, Flu - wants to wait until Oct, covid series completed Infectious disease screening: HIV indicated  Assistive device: none Oxygen STM:HDQQ Patient has a Dental home. Hospitalizations/ED visits: reviewed  Depression screen [facility] 2/9 10/10/2019 01/07/2018 05/03/2017 12/18/2014  Decreased Interest 0 0 3 0  Down, Depressed, Hopeless 0 0 3 0  PHQ - 2 Score 0 0 6 0  Altered sleeping - - 1 -  Tired, decreased energy - - 2 -  Change in appetite - - 2 -  Feeling bad or failure about yourself  - - 3 -  Trouble concentrating - - 1 -  Moving slowly or fidgety/restless - - 1 -  Suicidal thoughts - - 1 -  PHQ-9 Score - - 17 -   GAD 7 : Generalized Anxiety Score 05/03/2017  Nervous, Anxious, on Edge 3  Control/stop worrying 2    Worry too much - different things 2  Trouble relaxing 1  Restless 0  Easily annoyed or irritable 3  Afraid - awful might happen 2  Total GAD 7 Score 13  Anxiety Difficulty Very difficult    Immunization History  Administered Date(s) Administered  . Influenza,inj,Quad PF,6+ Mos 12/20/2018  . PFIZER SARS-COV-2 Vaccination 04/28/2019, 05/26/2019  . Tdap 06/19/2013    Past Medical History:  Diagnosis Date  . Allergy   . Anemia   . Anxiety   . Cervical radiculopathy 10/04/2016  . Depression   . Urinary incontinence    pt states with excercise only.    No Known Allergies Past Surgical History:  Procedure Laterality Date  . WISDOM TOOTH EXTRACTION     Family History  Problem Relation Age of Onset  . Hearing loss Mother   . COPD Father   . Heart disease Father   . Drug abuse Brother   . Alzheimer's disease Maternal Grandfather   . Diabetes Paternal Grandmother   . Drug abuse Paternal Grandfather   . Lung cancer Paternal Grandfather   . Breast cancer Neg Hx   . Colon cancer Neg Hx    Social History   Social History Narrative   Married. Spouse's name is Engineer, technical sales. They have 1 child, named Chief Executive Officer .    Firefighter. Master's Degree.   Patient drinks caffeine, uses herbal remedies, takes a daily vitamin.   Patient wears her seatbelt, she wears a bike  helmet. She exercises at least 3 times a week.   Patient does report a vegetarian diet.   There is a smoke detector in her home, she feels safe in her relationships.    Allergies as of 10/10/2019   No Known Allergies     Medication List       Accurate as of October 10, 2019  9:25 AM. If you have any questions, ask your nurse or doctor.        STOP taking these medications   predniSONE 10 MG tablet Commonly known as: DELTASONE Stopped by: Howard Pouch, DO     TAKE these medications   ALIVE MULTI-VITAMIN PO Take by mouth.   escitalopram 20 MG tablet Commonly known as: LEXAPRO Take 20 mg by mouth daily.    Vitamin D (Cholecalciferol) 50 MCG (2000 UT) Caps Take by mouth.       All past medical history, surgical history, allergies, family history, immunizations andmedications were updated in the EMR today and reviewed under the history and medication portions of their EMR.     No results found for this or any previous visit (from the past 2160 hour(s)).   ROS: 14 pt review of systems performed and negative (unless mentioned in an HPI)  Objective: BP 111/74 (BP Location: Left Arm, Patient Position: Sitting, Cuff Size: Normal)   Pulse 79   Temp 98.8 F (37.1 C) (Temporal)   Resp 16   Ht '5\' 8"'  (1.727 m)   Wt 173 lb (78.5 kg)   SpO2 97%   BMI 26.30 kg/m  Gen: Afebrile. No acute distress. Nontoxic in appearance, well-developed, well-nourished,  Pleasant female.  HENT: AT. [place]. Bilateral TM visualized and normal in appearance, normal external auditory canal. MMM, no oral lesions, adequate dentition. Bilateral nares within normal limits. Throat without erythema, ulcerations or exudates. no Cough on exam, no hoarseness on exam. Eyes:Pupils Equal Round Reactive to light, Extraocular movements intact,  Conjunctiva without redness, discharge or icterus. Neck/lymp/endocrine: Supple,no lymphadenopathy, no thyromegaly CV: RRR no murmur, no edema, +2/4 P posterior tibialis pulse. No JVD. Chest: CTAB, no wheeze, rhonchi or crackles. normal Respiratory effort. good Air movement. Abd: Soft. flat. NTND. BS present. no Masses palpated. No hepatosplenomegaly. No rebound tenderness or guarding. Skin: no rashes, purpura or petechiae. Warm and well-perfused. Skin intact. Neuro/Msk:  Normal gait. PERLA. EOMi. Alert. Oriented x3.  Cranial nerves II through XII intact. Muscle strength 5/5 upper/lower extremity. DTRs equal bilaterally. Psych: Normal affect, dress and demeanor. Normal speech. Normal thought content and judgment.  No exam data present  Assessment/plan: Stacy Bush is a 49 y.o. female present  for  Abnormal TSH - TSH Screening for deficiency anemia - CBC Encounter for long-term current use of medication - Comp Met (CMET) Diabetes mellitus screening - Hemoglobin A1c Screening cholesterol level - Lipid panel Colon cancer screening - Cologuard Need for hepatitis C screening test - Hepatitis C Antibody Vit D def:  vit d level collected today Encounter for preventive health examination Patient was encouraged to exercise greater than 150 minutes a week. Patient was encouraged to choose a diet filled with fresh fruits and vegetables, and lean meats. AVS provided to patient today for education/recommendation on gender specific health and safety maintenance. Colonoscopy: No FHX, Routine screen at 45> cologuard ordered Mammogram: last mammogram 06/2018- GYN  Cervical cancer screening: PAPs norma 06/2018 Immunizations: Tdap 2014, Flu - wants to wait until Oct, covid series completed Infectious disease screening: HIV declined. Hep C screen desired  Return  in about 1 year (around 10/09/2020) for CPE (30 min).  Orders Placed This Encounter  Procedures  . CBC  . Comp Met (CMET)  . TSH  . Hemoglobin A1c  . Lipid panel  . Cologuard  . Hepatitis C Antibody   No orders of the defined types were placed in this encounter.  Referral Orders  No referral(s) requested today     Electronically signed by: Howard Pouch, [place]

## 2019-10-14 LAB — HEPATITIS C ANTIBODY
Hepatitis C Ab: NONREACTIVE
SIGNAL TO CUT-OFF: 0.02 (ref <1.00)

## 2019-10-14 NOTE — Progress Notes (Signed)
Notified pt of lab results 

## 2020-04-02 IMAGING — CT CT ABDOMEN AND PELVIS WITH CONTRAST
2 of 5 series · 16 of 46 positions shown, 18 images · IV contrast (APPLIED)
Comparison: None.

CLINICAL DATA: Abdominal pain with diverticulitis suspected

EXAM:
CT ABDOMEN AND PELVIS WITH CONTRAST
TECHNIQUE: Multidetector CT imaging of the abdomen and pelvis was performed
using the standard protocol following bolus administration of
intravenous contrast.
CONTRAST:  100mL OMNIPAQUE IOHEXOL 300 MG/ML  SOLN

[Series 2: axial st · axial · 0.80mm/px · z∈[-626,-211]mm · 13 of 93 slices shown, 15 images]
[im 5/93  soft-tissue]
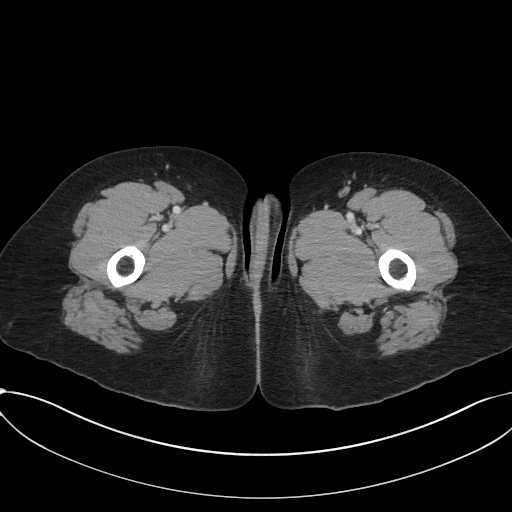
[im 5/93  bone]
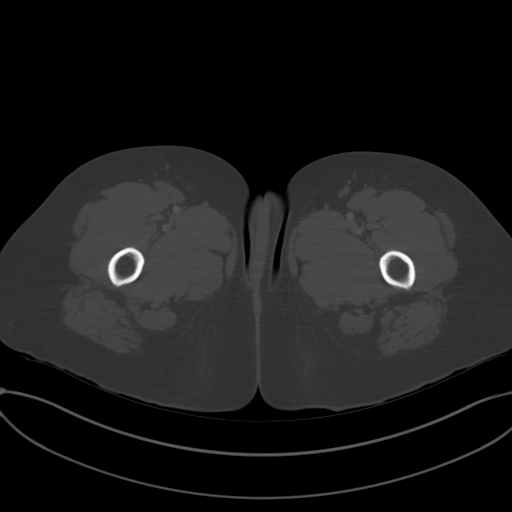
[im 15/93  soft-tissue]
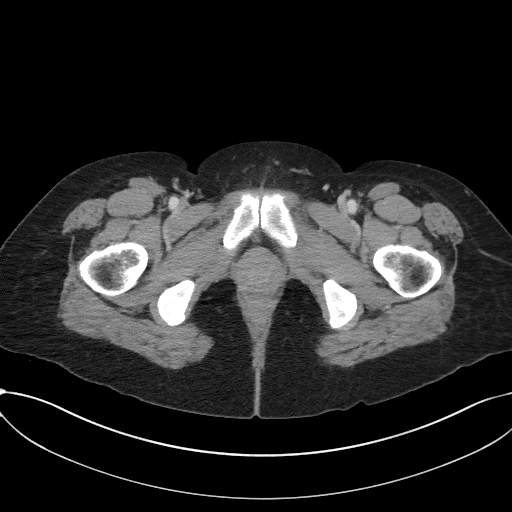
[im 20/93  soft-tissue]
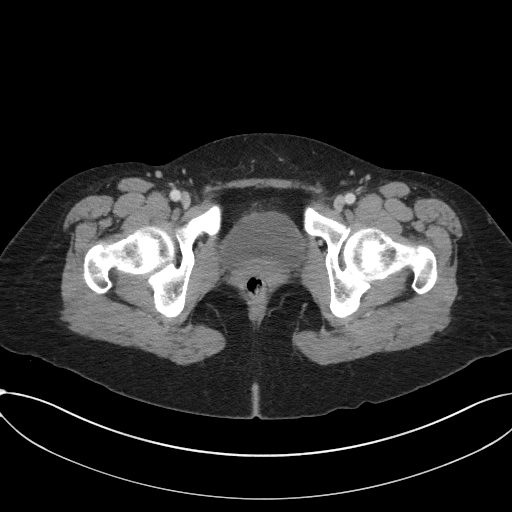
[im 25/93  soft-tissue]
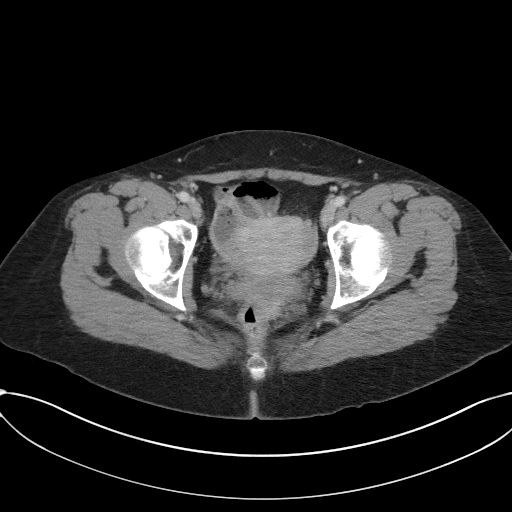
[im 34/93  soft-tissue]
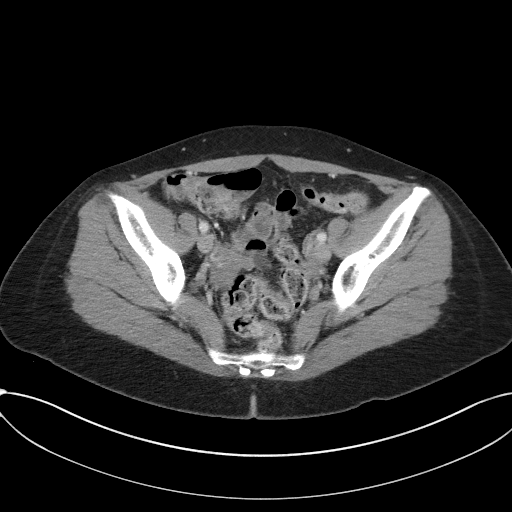
[im 39/93  soft-tissue]
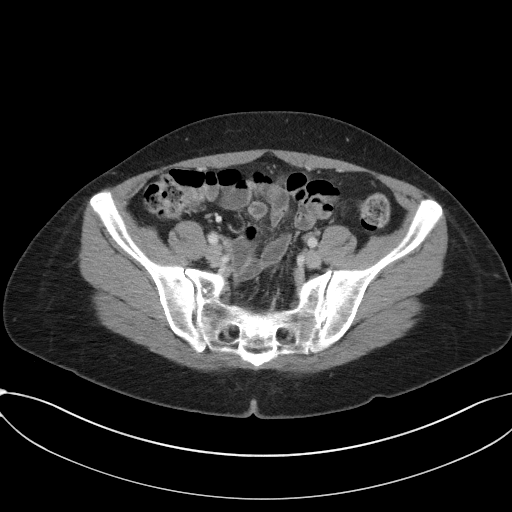
[im 49/93  soft-tissue]
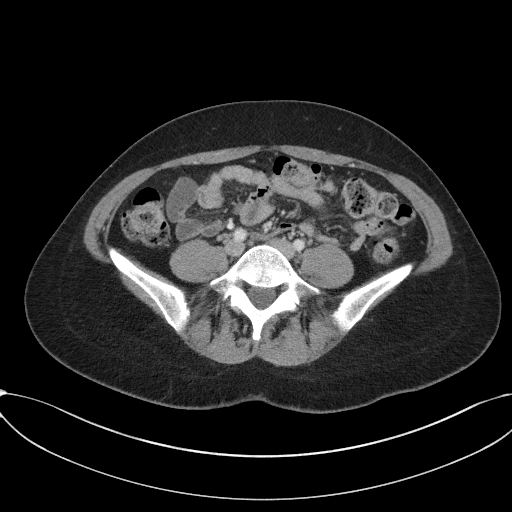
[im 54/93  soft-tissue]
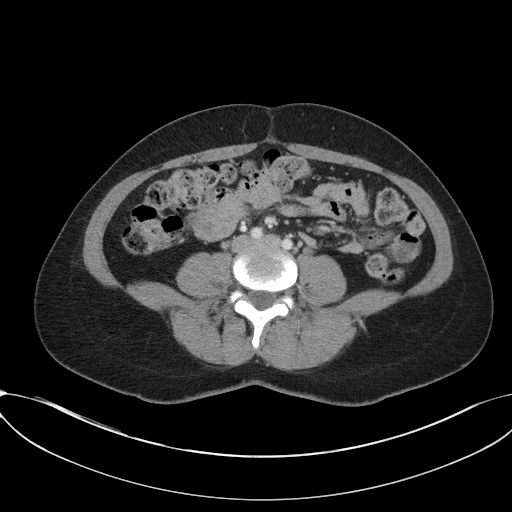
[im 59/93  soft-tissue]
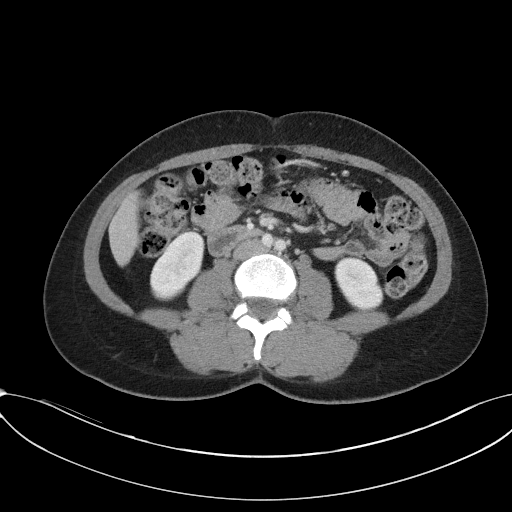
[im 59/93  bone]
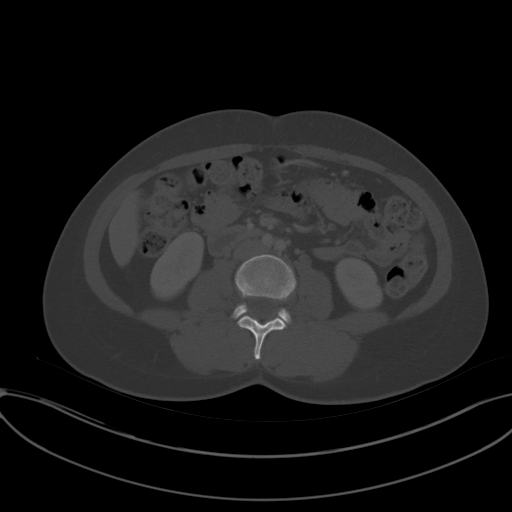
[im 68/93  soft-tissue]
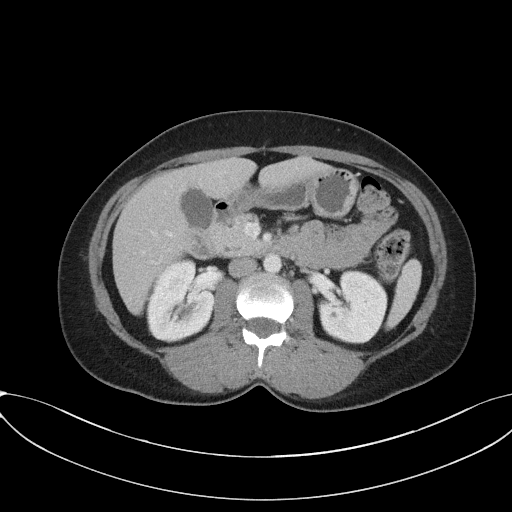
[im 73/93  soft-tissue]
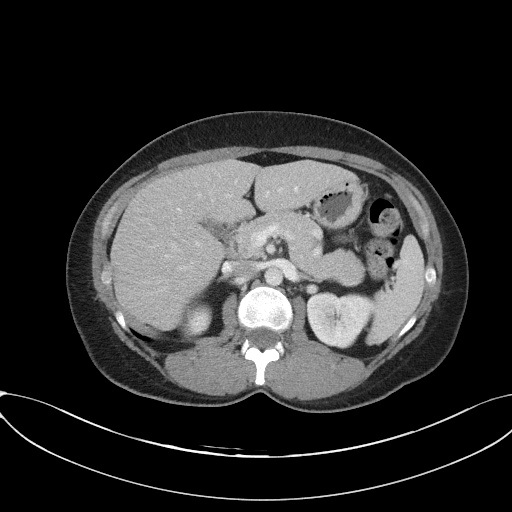
[im 78/93  soft-tissue]
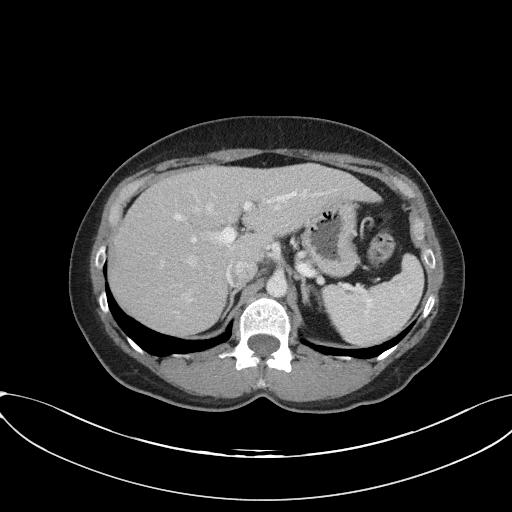
[im 88/93  soft-tissue]
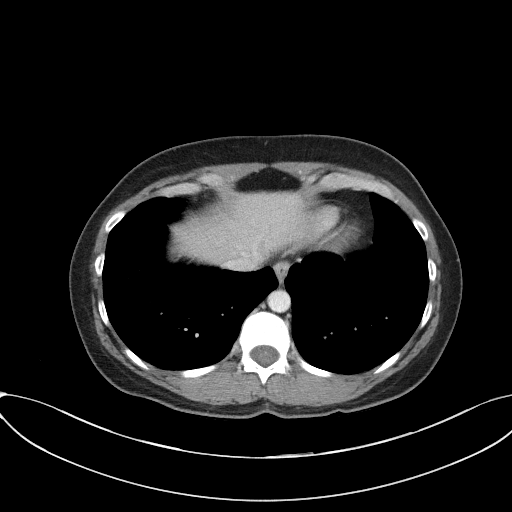

[Series 4: coronal st · coronal · 0.86mm/px · 3 of 79 slices shown]
[im 27/79  soft-tissue]
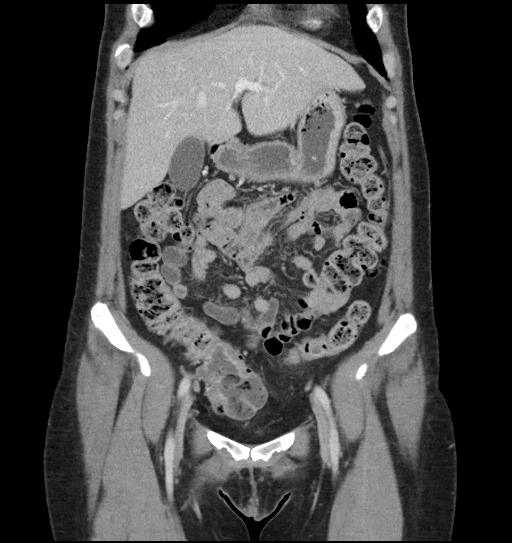
[im 35/79  soft-tissue]
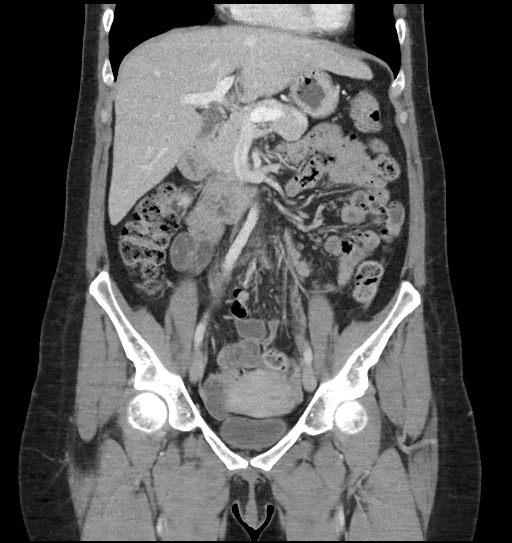
[im 44/79  soft-tissue]
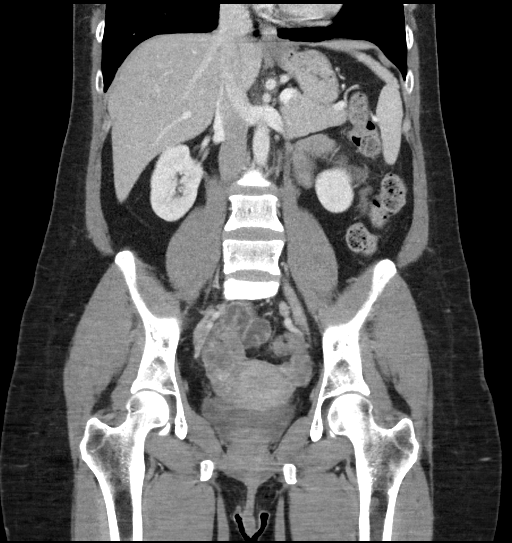

[16 of 46 positions shown; findings below may reference images not displayed]

FINDINGS: Lower chest: The lung bases are clear. The heart size is normal.

Hepatobiliary: The liver is normal. Normal gallbladder.There is no
biliary ductal dilation.

Pancreas: Normal contours without ductal dilatation. No
peripancreatic fluid collection.

Spleen: No splenic laceration or hematoma.

Adrenals/Urinary Tract:

--Adrenal glands: No adrenal hemorrhage.

--Right kidney/ureter: No hydronephrosis or perinephric hematoma.

--Left kidney/ureter: No hydronephrosis or perinephric hematoma.

--Urinary bladder: Unremarkable.

Stomach/Bowel:

--Stomach/Duodenum: No hiatal hernia or other gastric abnormality.
Normal duodenal course and caliber.

--Small bowel: No dilatation or inflammation.

--Colon: No focal abnormality.

--Appendix: The appendix is not reliably identified. It is likely
adjacent to the right ovary, but is not clearly visualized.

Vascular/Lymphatic: Normal course and caliber of the major abdominal
vessels.

--No retroperitoneal lymphadenopathy.

--No mesenteric lymphadenopathy.

--No pelvic or inguinal lymphadenopathy.

Reproductive: Unremarkable

Other: There is a small volume of pelvic free fluid which is likely
physiologic. No free air. The abdominal wall is normal.

Musculoskeletal. No acute displaced fractures.
IMPRESSION: 1. No definite acute intra-abdominal abnormality detected.
2. Small amount of free fluid in the pelvis, likely physiologic.
3. The appendix is not reliably identified on this exam.
4. No CT evidence for diverticulitis.  No small bowel obstruction.

## 2020-06-28 ENCOUNTER — Other Ambulatory Visit: Payer: Self-pay

## 2020-06-28 ENCOUNTER — Encounter: Payer: Self-pay | Admitting: Family Medicine

## 2020-06-28 ENCOUNTER — Ambulatory Visit: Payer: BC Managed Care – PPO | Admitting: Family Medicine

## 2020-06-28 VITALS — BP 103/68 | HR 78 | Temp 98.4°F | Wt 168.0 lb

## 2020-06-28 DIAGNOSIS — R519 Headache, unspecified: Secondary | ICD-10-CM

## 2020-06-28 DIAGNOSIS — M791 Myalgia, unspecified site: Secondary | ICD-10-CM | POA: Diagnosis not present

## 2020-06-28 LAB — CBC
HCT: 39.9 % (ref 36.0–46.0)
Hemoglobin: 13.4 g/dL (ref 12.0–15.0)
MCHC: 33.5 g/dL (ref 30.0–36.0)
MCV: 91.3 fl (ref 78.0–100.0)
Platelets: 252 10*3/uL (ref 150.0–400.0)
RBC: 4.37 Mil/uL (ref 3.87–5.11)
RDW: 13 % (ref 11.5–15.5)
WBC: 6 10*3/uL (ref 4.0–10.5)

## 2020-06-28 NOTE — Progress Notes (Signed)
This visit occurred during the SARS-CoV-2 public health emergency.  Safety protocols were in place, including screening questions prior to the visit, additional usage of staff PPE, and extensive cleaning of exam room while observing appropriate contact time as indicated for disinfecting solutions.    Stacy Bush , 1970/04/07, 50 y.o., female MRN: [id number] Patient Care Team    Relationship Specialty Notifications Start End  Natalia Leatherwood, DO PCP - General Family Medicine  12/18/14   Huel Cote, MD Consulting Physician Obstetrics and Gynecology  10/10/19     Chief Complaint  Patient presents with  . Neck Pain    Pt c/o neck and shoulder pain described as stiffness x 2 weeks; pt also reports freq HA, fatigue, chills, x 4 weeks     Subjective: Pt presents for an OV with complaints of headache, intermittent chills and fatigue of 4 weeks duration.  She states the headache is a frontal headache.  It does resolve with OTC management.  She states the headaches have just been occurring more frequently which had her concerned.  She has shoulder and neck discomfort.  She states she does go to the chiropractor about once a month and she does not feel with something that can be adjusted through chiropractic care.  She had not had a recent injury that she is aware of but she has been doing more yard work over the last few months.  sHe does endorse occasional sinus pain that she states is more maxillary and in her eyes.  She currently is not taking an antihistamine. She reports she has been out in the yard a great deal and has found some ticks, that has been the larger type.  She does work out in yard group and has concerns that her symptoms could be related to Lyme.  Her sister-in-law was recently diagnosed with Lyme disease.  Patient denies rash.  Depression screen [facility] 2/9 06/28/2020 10/10/2019 01/07/2018 05/03/2017 12/18/2014  Decreased Interest 0 0 0 3 0  Down, Depressed, Hopeless 0 0 0 3 0  PHQ  - 2 Score 0 0 0 6 0  Altered sleeping - - - 1 -  Tired, decreased energy - - - 2 -  Change in appetite - - - 2 -  Feeling bad or failure about yourself  - - - 3 -  Trouble concentrating - - - 1 -  Moving slowly or fidgety/restless - - - 1 -  Suicidal thoughts - - - 1 -  PHQ-9 Score - - - 17 -    No Known Allergies Social History   Social History Narrative   Married. Spouse's name is Optometrist. They have 1 child, named Armed forces technical officer .    Museum/gallery exhibitions officer. Master's Degree.   Patient drinks caffeine, uses herbal remedies, takes a daily vitamin.   Patient wears her seatbelt, she wears a bike helmet. She exercises at least 3 times a week.   Patient does report a vegetarian diet.   There is a smoke detector in her home, she feels safe in her relationships.   Past Medical History:  Diagnosis Date  . Allergy   . Anemia   . Anxiety   . Cervical radiculopathy 10/04/2016  . Depression   . Urinary incontinence    pt states with excercise only.    Past Surgical History:  Procedure Laterality Date  . WISDOM TOOTH EXTRACTION     Family History  Problem Relation Age of Onset  . Hearing loss Mother   .  COPD Father   . Heart disease Father   . Drug abuse Brother   . Alzheimer's disease Maternal Grandfather   . Diabetes Paternal Grandmother   . Drug abuse Paternal Grandfather   . Lung cancer Paternal Grandfather   . Breast cancer Neg Hx   . Colon cancer Neg Hx    Allergies as of 06/28/2020   No Known Allergies     Medication List       Accurate as of Jun 28, 2020 12:21 PM. If you have any questions, ask your nurse or doctor.        ALIVE MULTI-VITAMIN PO Take by mouth.   CHASTE TREE PO Take by mouth.   escitalopram 20 MG tablet Commonly known as: LEXAPRO Take 10 mg by mouth daily.   FISH OIL PO Take 1,080 mg by mouth.   PROBIOTIC-PREBIOTIC PO Take by mouth.   vitamin D3 50 MCG (2000 UT) Caps Take by mouth.       All past medical history, surgical history, allergies,  family history, immunizations andmedications were updated in the EMR today and reviewed under the history and medication portions of their EMR.     ROS: Negative, with the exception of above mentioned in HPI   Objective:  BP 103/68   Pulse 78   Temp 98.4 F (36.9 C) (Oral)   Wt 168 lb (76.2 kg)   SpO2 99%   BMI 25.54 kg/m  Body mass index is 25.54 kg/m. Gen: Afebrile. No acute distress. Nontoxic in appearance, well developed, well nourished.  HENT: AT. [place]. Bilateral TM visualized with very mild effusions bilaterally, no erythema or bulging. MMM, no oral lesions. Bilateral nares with mild erythema and mild drainage. Throat without erythema or exudates.  No cough.  No hoarseness. Eyes:Pupils Equal Round Reactive to light, Extraocular movements intact,  Conjunctiva without redness, discharge or icterus. Neck/lymp/endocrine: Supple, no lymphadenopathy CV: RRR no murmur, no edema Chest: CTAB, no wheeze or crackles. Good air movement, normal resp effort.  MSK: Cervical spine: No erythema, no bony tenderness.  Full range of motion on all planes without difficulty.  Mid trap bilaterally with ropiness.  Neurovascular intact distally.  Negative Brudzinski Skin: No rashes, purpura or petechiae.  Neuro:  Normal gait. PERLA. EOMi. Alert. Oriented x3  Psych: Normal affect, dress and demeanor. Normal speech. Normal thought content and judgment.  No exam data present No results found. No results found for this or any previous visit (from the past 24 hour(s)).  Assessment/Plan: Stacy Bush is a 50 y.o. female present for OV for  Nonintractable headache, unspecified chronicity pattern, unspecified headache type She does have mild sinus/allergy signs on exam today.  Nothing that would seem to be currently infectious. Would encourage antihistamine start. - B. burgdorfi antibodies - CBC - Rocky mtn spotted fvr abs pnl(IgG+IgM)  Myalgia Shoulder/trap to do appear tense on exam today.  Full range  of motion of cervical spine negative Brudzinski.  She does endorse stress and sleep disturbance, but nothing at home as her only concern is for stress level.  She has been cutting back on her Lexapro. We will move forward and rule out infection or tickborne diseases possible cause. - B. burgdorfi antibodies - CBC - Rocky mtn spotted fvr abs pnl(IgG+IgM)   Reviewed expectations re: course of current medical issues.  Discussed self-management of symptoms.  Outlined signs and symptoms indicating need for more acute intervention.  Patient verbalized understanding and all questions were answered.  Patient received an After-Visit  Summary.    Orders Placed This Encounter  Procedures  . B. burgdorfi antibodies  . CBC  . Rocky mtn spotted fvr abs pnl(IgG+IgM)   No orders of the defined types were placed in this encounter.  Referral Orders  No referral(s) requested today     Note is dictated utilizing voice recognition software. Although note has been proof read prior to signing, occasional typographical errors still can be missed. If any questions arise, please do not hesitate to call for verification.   electronically signed by:  Felix Pacini, DO  [facility] [place]

## 2020-06-28 NOTE — Patient Instructions (Signed)
Lyme Disease Lyme disease is an infection that can affect many parts of the body, including the skin, joints, and nervous system. It is a bacterial infection that starts from the bite of an infected tick. Over time, the infection can worsen, and some of the symptoms are similar to the flu. If Lyme disease is not treated, it may cause joint pain, swelling, numbness, problems thinking, fatigue, muscle weakness, and other problems. What are the causes? This condition is caused by bacteria called Borrelia burgdorferi.  You can get Lyme disease by being bitten by an infected tick.  Only black-legged, or Ixodes, ticks that are infected with the bacteria can cause Lyme disease.  The tick must be attached to your skin for a certain period of time to pass along the infection. This is usually 36-48 hours.  Deer often carry infected ticks. What increases the risk? The following factors may make you more likely to develop this condition:  Living in or visiting these areas in the [place] ? [place]. ? [place]. ? [place].  Spending time in wooded or grassy areas.  Being outdoors with exposed skin.  Camping, gardening, hiking, fishing, hunting, or working outdoors.  Failing to remove a tick from your skin. What are the signs or symptoms? Symptoms of this condition may include:  Chills and fever.  Headache.  Fatigue.  General achiness.  Muscle pain.  Joint pain, often in the knees.  A round, red rash that surrounds the center of the tick bite. The center of the rash may be blood colored or have tiny blisters.  Swollen lymph glands.  Stiff neck.   How is this diagnosed? This condition is diagnosed based on:  Your symptoms and medical history.  A physical exam.  A blood test. How is this treated? The main treatment for this condition is antibiotic medicine, which is usually taken by mouth (orally).  The length of treatment depends on how soon after a  tick bite you begin taking the medicine. In some cases, treatment is necessary for several weeks.  If the infection is severe, antibiotics may need to be given through an IV that is inserted into one of your veins. Follow these instructions at home:  Take over-the-counter and prescription medicines only as told by your health care provider. Finish all antibiotic medicine, even when you start to feel better.  Ask your health care provider about taking a probiotic in between doses of your antibiotic to help avoid an upset stomach or diarrhea.  Check with your health care provider before supplementing your treatment. Many alternative therapies have not been proven and may be harmful to you.  Keep all follow-up visits as told by your health care provider. This is important. How is this prevented? You can become reinfected if you get another tick bite from an infected tick. Take these steps to help prevent an infection:  Cover your skin with light-colored clothing when you are outdoors in the spring and summer months.  Spray clothing and skin with bug spray. The spray should be 20-30% DEET. You can also treat clothing with permethrin, and let it dry before you wear it. Do not apply permethrin directly to your skin. Permethrin can also be used to treat camping gear and boots. Always read and follow the instructions that come with a bug spray or insecticide.  Avoid wooded, grassy, and shaded areas.  Remove yard litter, brush, trash, and plants that attract deer and rodents.  Check yourself for   ticks when you come indoors.  Wash clothing worn each day.  Shower after spending time outdoors.  Check your pets for ticks before they come inside.  If you find a tick attached to your skin: ? Remove it with tweezers. ? Clean your hands and the bite area with rubbing alcohol or soap and water. ? Dispose of the tick by putting it in rubbing alcohol, putting it in a sealed bag or container, or  flushing it down the toilet. ? You may choose to save the tick in a sealed container if you wish for it to be tested at a later time. Pregnant women should take special care to avoid tick bites because it is possible that the infection may be passed along to the fetus.   Contact a health care provider if:  You have symptoms after treatment.  You have removed a tick and want to bring it to your health care provider for testing. Get help right away if:  You have an irregular heartbeat.  You have chest pain.  You have nerve pain.  Your face feels numb.  You develop the following: ? A stiff neck. ? A severe headache. ? Severe nausea and vomiting. ? Sensitivity to light. Summary  Lyme disease is an infection that can affect many parts of the body, including the skin, joints, and nervous system.  This condition is caused by bacteria called Borrelia burgdorferi.  You can get Lyme disease by being bitten by an infected tick.  The main treatment for this condition is antibiotic medicine. This information is not intended to replace advice given to you by your health care provider. Make sure you discuss any questions you have with your health care provider. Document Revised: 05/17/2018 Document Reviewed: 04/11/2018 Elsevier Patient Education  2021 Elsevier Inc.  

## 2020-06-30 LAB — B. BURGDORFI ANTIBODIES: B burgdorferi Ab IgG+IgM: <0.9 index

## 2020-06-30 LAB — ROCKY MTN SPOTTED FVR ABS PNL(IGG+IGM)
RMSF IgG: NOT DETECTED
RMSF IgM: NOT DETECTED

## 2021-05-23 ENCOUNTER — Telehealth: Payer: Self-pay | Admitting: Family Medicine

## 2021-05-23 ENCOUNTER — Ambulatory Visit (INDEPENDENT_AMBULATORY_CARE_PROVIDER_SITE_OTHER): Payer: BC Managed Care – PPO | Admitting: Family Medicine

## 2021-05-23 ENCOUNTER — Encounter: Payer: Self-pay | Admitting: Family Medicine

## 2021-05-23 VITALS — BP 109/69 | HR 74 | Temp 98.2°F | Ht 68.0 in | Wt 179.0 lb

## 2021-05-23 DIAGNOSIS — R7989 Other specified abnormal findings of blood chemistry: Secondary | ICD-10-CM

## 2021-05-23 DIAGNOSIS — Z131 Encounter for screening for diabetes mellitus: Secondary | ICD-10-CM | POA: Diagnosis not present

## 2021-05-23 DIAGNOSIS — E559 Vitamin D deficiency, unspecified: Secondary | ICD-10-CM

## 2021-05-23 DIAGNOSIS — Z0000 Encounter for general adult medical examination without abnormal findings: Secondary | ICD-10-CM | POA: Diagnosis not present

## 2021-05-23 DIAGNOSIS — Z13220 Encounter for screening for lipoid disorders: Secondary | ICD-10-CM

## 2021-05-23 DIAGNOSIS — R413 Other amnesia: Secondary | ICD-10-CM | POA: Diagnosis not present

## 2021-05-23 DIAGNOSIS — Z789 Other specified health status: Secondary | ICD-10-CM

## 2021-05-23 DIAGNOSIS — Z1211 Encounter for screening for malignant neoplasm of colon: Secondary | ICD-10-CM

## 2021-05-23 LAB — CBC WITH DIFFERENTIAL/PLATELET
Basophils Absolute: 0.1 10*3/uL (ref 0.0–0.1)
Basophils Relative: 0.9 % (ref 0.0–3.0)
Eosinophils Absolute: 0.1 10*3/uL (ref 0.0–0.7)
Eosinophils Relative: 1.1 % (ref 0.0–5.0)
HCT: 40.3 % (ref 36.0–46.0)
Hemoglobin: 13.4 g/dL (ref 12.0–15.0)
Lymphocytes Relative: 28.7 % (ref 12.0–46.0)
Lymphs Abs: 1.7 10*3/uL (ref 0.7–4.0)
MCHC: 33.3 g/dL (ref 30.0–36.0)
MCV: 90.5 fl (ref 78.0–100.0)
Monocytes Absolute: 0.4 10*3/uL (ref 0.1–1.0)
Monocytes Relative: 6.4 % (ref 3.0–12.0)
Neutro Abs: 3.8 10*3/uL (ref 1.4–7.7)
Neutrophils Relative %: 62.9 % (ref 43.0–77.0)
Platelets: 285 10*3/uL (ref 150.0–400.0)
RBC: 4.45 Mil/uL (ref 3.87–5.11)
RDW: 12.4 % (ref 11.5–15.5)
WBC: 6.1 10*3/uL (ref 4.0–10.5)

## 2021-05-23 LAB — TSH: TSH: 0.71 u[IU]/mL (ref 0.35–5.50)

## 2021-05-23 LAB — LIPID PANEL
Cholesterol: 147 mg/dL (ref 0–200)
HDL: 50.2 mg/dL (ref >39.00)
LDL Cholesterol: 82 mg/dL (ref 0–99)
NonHDL: 97.05
Total CHOL/HDL Ratio: 3
Triglycerides: 76 mg/dL (ref 0.0–149.0)
VLDL: 15.2 mg/dL (ref 0.0–40.0)

## 2021-05-23 LAB — COMPREHENSIVE METABOLIC PANEL
ALT: 10 U/L (ref 0–35)
AST: 15 U/L (ref 0–37)
Albumin: 4.4 g/dL (ref 3.5–5.2)
Alkaline Phosphatase: 37 U/L — ABNORMAL LOW (ref 39–117)
BUN: 11 mg/dL (ref 6–23)
CO2: 28 mEq/L (ref 19–32)
Calcium: 9 mg/dL (ref 8.4–10.5)
Chloride: 104 mEq/L (ref 96–112)
Creatinine, Ser: 0.72 mg/dL (ref 0.40–1.20)
GFR: 98.75 mL/min (ref >60.00)
Glucose, Bld: 95 mg/dL (ref 70–99)
Potassium: 4.2 mEq/L (ref 3.5–5.1)
Sodium: 137 mEq/L (ref 135–145)
Total Bilirubin: 0.4 mg/dL (ref 0.2–1.2)
Total Protein: 7.1 g/dL (ref 6.0–8.3)

## 2021-05-23 LAB — VITAMIN D 25 HYDROXY (VIT D DEFICIENCY, FRACTURES): VITD: 27.67 ng/mL — ABNORMAL LOW (ref 30.00–100.00)

## 2021-05-23 LAB — HEMOGLOBIN A1C: Hgb A1c MFr Bld: 5.9 % (ref 4.6–6.5)

## 2021-05-23 LAB — VITAMIN B12: Vitamin B-12: 386 pg/mL (ref 211–911)

## 2021-05-23 NOTE — Patient Instructions (Signed)
No follow-ups on file.        Great to see you today.  I have refilled the medication(s) we provide.   If labs were collected, we will inform you of lab results once received either by echart message or telephone call.   - echart message- for normal results that have been seen by the patient already.   - telephone call: abnormal results or if patient has not viewed results in their echart.  Health Maintenance, Female Adopting a healthy lifestyle and getting preventive care are important in promoting health and wellness. Ask your health care provider about: The right schedule for you to have regular tests and exams. Things you can do on your own to prevent diseases and keep yourself healthy. What should I know about diet, weight, and exercise? Eat a healthy diet  Eat a diet that includes plenty of vegetables, fruits, low-fat dairy products, and lean protein. Do not eat a lot of foods that are high in solid fats, added sugars, or sodium. Maintain a healthy weight Body mass index (BMI) is used to identify weight problems. It estimates body fat based on height and weight. Your health care provider can help determine your BMI and help you achieve or maintain a healthy weight. Get regular exercise Get regular exercise. This is one of the most important things you can do for your health. Most adults should: Exercise for at least 150 minutes each week. The exercise should increase your heart rate and make you sweat (moderate-intensity exercise). Do strengthening exercises at least twice a week. This is in addition to the moderate-intensity exercise. Spend less time sitting. Even light physical activity can be beneficial. Watch cholesterol and blood lipids Have your blood tested for lipids and cholesterol at 49 years of age, then have this test every 5 years. Have your cholesterol levels checked more often if: Your lipid or cholesterol levels are high. You are older than 49 years of  age. You are at high risk for heart disease. What should I know about cancer screening? Depending on your health history and family history, you may need to have cancer screening at various ages. This may include screening for: Breast cancer. Cervical cancer. Colorectal cancer. Skin cancer. Lung cancer. What should I know about heart disease, diabetes, and high blood pressure? Blood pressure and heart disease High blood pressure causes heart disease and increases the risk of stroke. This is more likely to develop in people who have high blood pressure readings or are overweight. Have your blood pressure checked: Every 3-5 years if you are 18-39 years of age. Every year if you are 40 years old or older. Diabetes Have regular diabetes screenings. This checks your fasting blood sugar level. Have the screening done: Once every three years after age 40 if you are at a normal weight and have a low risk for diabetes. More often and at a younger age if you are overweight or have a high risk for diabetes. What should I know about preventing infection? Hepatitis B If you have a higher risk for hepatitis B, you should be screened for this virus. Talk with your health care provider to find out if you are at risk for hepatitis B infection. Hepatitis C Testing is recommended for: Everyone born from 1945 through 1965. Anyone with known risk factors for hepatitis C. Sexually transmitted infections (STIs) Get screened for STIs, including gonorrhea and chlamydia, if: You are sexually active and are younger than 49 years of age. You are   older than 49 years of age and your health care provider tells you that you are at risk for this type of infection. Your sexual activity has changed since you were last screened, and you are at increased risk for chlamydia or gonorrhea. Ask your health care provider if you are at risk. Ask your health care provider about whether you are at high risk for HIV. Your health  care provider may recommend a prescription medicine to help prevent HIV infection. If you choose to take medicine to prevent HIV, you should first get tested for HIV. You should then be tested every 3 months for as long as you are taking the medicine. Pregnancy If you are about to stop having your period (premenopausal) and you may become pregnant, seek counseling before you get pregnant. Take 400 to 800 micrograms (mcg) of folic acid every day if you become pregnant. Ask for birth control (contraception) if you want to prevent pregnancy. Osteoporosis and menopause Osteoporosis is a disease in which the bones lose minerals and strength with aging. This can result in bone fractures. If you are 65 years old or older, or if you are at risk for osteoporosis and fractures, ask your health care provider if you should: Be screened for bone loss. Take a calcium or vitamin D supplement to lower your risk of fractures. Be given hormone replacement therapy (HRT) to treat symptoms of menopause. Follow these instructions at home: Alcohol use Do not drink alcohol if: Your health care provider tells you not to drink. You are pregnant, may be pregnant, or are planning to become pregnant. If you drink alcohol: Limit how much you have to: 0-1 drink a day. Know how much alcohol is in your drink. In the [place] one drink equals one 12 oz bottle of beer (355 mL), one 5 oz glass of wine (148 mL), or one 1 oz glass of hard liquor (44 mL). Lifestyle Do not use any products that contain nicotine or tobacco. These products include cigarettes, chewing tobacco, and vaping devices, such as e-cigarettes. If you need help quitting, ask your health care provider. Do not use street drugs. Do not share needles. Ask your health care provider for help if you need support or information about quitting drugs. General instructions Schedule regular health, dental, and eye exams. Stay current with your vaccines. Tell your health  care provider if: You often feel depressed. You have ever been abused or do not feel safe at home. Summary Adopting a healthy lifestyle and getting preventive care are important in promoting health and wellness. Follow your health care provider's instructions about healthy diet, exercising, and getting tested or screened for diseases. Follow your health care provider's instructions on monitoring your cholesterol and blood pressure. This information is not intended to replace advice given to you by your health care provider. Make sure you discuss any questions you have with your health care provider. Document Revised: 06/14/2020 Document Reviewed: 06/14/2020 Elsevier Patient Education  2023 Elsevier Inc.  

## 2021-05-23 NOTE — Progress Notes (Signed)
? ?This visit occurred during the SARS-CoV-2 public health emergency.  Safety protocols were in place, including screening questions prior to the visit, additional usage of staff PPE, and extensive cleaning of exam room while observing appropriate contact time as indicated for disinfecting solutions.  ? ? ?Patient ID: Stacy Bush, female  DOB: [birth year], 51 y.o.   MRN: [id number] ?Patient Care Team  ?  Relationship Specialty Notifications Start End  ?Natalia Leatherwood, DO PCP - General Family Medicine  12/18/14   ?Huel Cote, MD Consulting Physician Obstetrics and Gynecology  10/10/19   ? ? ?Chief Complaint  ?Patient presents with  ? Annual Exam  ?  Pt is fasting;   ? ? ?Subjective: ?Stacy Bush is a 51 y.o.  Female  present for CPE. ?All past medical history, surgical history, allergies, family history, immunizations, medications and social history were updated in the electronic medical record today. ?All recent labs, ED visits and hospitalizations within the last year were reviewed. ? ?Health maintenance:  ?Colonoscopy: No FHX, Routine screen at 47> referred to GI ?Mammogram: last mammogram 06/2018- GYN  ?Cervical cancer screening: PAPs normal UTD through gyn> records requested ?Immunizations: Tdap 2014, flu shot - encouraged yearly, covid series completed ?Infectious disease screening: HIV  declined ?Assistive device: none ?Oxygen YYQ:MGNO ?Patient has a Dental home. ?Hospitalizations/ED visits: reviewed ? ? ?Pt reports she has noticed she is more forgetful and have changes in memory. She is no longer following a vegetarian diet.  ? ? ?  05/23/2021  ?  9:03 AM 06/28/2020  ? 11:39 AM 10/10/2019  ?  9:10 AM 01/07/2018  ?  5:35 PM 05/03/2017  ? 11:04 AM  ?Depression screen PHQ 2/9  ?Decreased Interest 0 0 0 0 3  ?Down, Depressed, Hopeless 0 0 0 0 3  ?PHQ - 2 Score 0 0 0 0 6  ?Altered sleeping 0    1  ?Tired, decreased energy 0    2  ?Change in appetite 2    2  ?Feeling bad or failure about yourself  0    3  ?Trouble  concentrating 0    1  ?Moving slowly or fidgety/restless 0    1  ?Suicidal thoughts 0    1  ?PHQ-9 Score 2    17  ? ? ?  05/23/2021  ?  9:09 AM 05/03/2017  ? 11:05 AM  ?GAD 7 : Generalized Anxiety Score  ?Nervous, Anxious, on Edge 1 3  ?Control/stop worrying 0 2  ?Worry too much - different things 0 2  ?Trouble relaxing 0 1  ?Restless 0 0  ?Easily annoyed or irritable 0 3  ?Afraid - awful might happen 0 2  ?Total GAD 7 Score 1 13  ?Anxiety Difficulty  Very difficult  ? ? ?Immunization History  ?Administered Date(s) Administered  ? Influenza,inj,Quad PF,6+ Mos 12/20/2018  ? PFIZER(Purple Top)SARS-COV-2 Vaccination 04/28/2019, 05/26/2019  ? Tdap 06/19/2013  ? ? ? ?Past Medical History:  ?Diagnosis Date  ? Allergy   ? Anemia   ? Anxiety   ? Cervical radiculopathy 10/04/2016  ? Depression   ? Urinary incontinence   ? pt states with excercise only.   ? ?No Known Allergies ?Past Surgical History:  ?Procedure Laterality Date  ? WISDOM TOOTH EXTRACTION    ? ?Family History  ?Problem Relation Age of Onset  ? Hearing loss Mother   ? COPD Father   ? Heart disease Father   ? Drug abuse Brother   ? Alzheimer's disease  Maternal Grandfather   ? Diabetes Paternal Grandmother   ? Drug abuse Paternal Grandfather   ? Lung cancer Paternal Grandfather   ? Breast cancer Neg Hx   ? Colon cancer Neg Hx   ? ?Social History  ? ?Social History Narrative  ? Married. Spouse's name is OptometristBill. They have 1 child, named Bush .   ? Registered Dietitian. Master's Degree.  ? Patient drinks caffeine, uses herbal remedies, takes a daily vitamin.  ? Patient wears her seatbelt, she wears a bike helmet. She exercises at least 3 times a week.  ? Patient does report a vegetarian diet.  ? There is a smoke detector in her home, she feels safe in her relationships.  ? ? ?Allergies as of 05/23/2021   ?No Known Allergies ?  ? ?  ?Medication List  ?  ? ?  ? Accurate as of May 23, 2021  9:30 AM. If you have any questions, ask your nurse or doctor.  ?  ?  ? ?  ? ?STOP  taking these medications   ? ?CHASTE TREE PO ?Stopped by: Felix Pacinienee Haliey Romberg, DO ?  ? ?  ? ?TAKE these medications   ? ?ALIVE MULTI-VITAMIN PO ?Take by mouth. ?  ?escitalopram 20 MG tablet ?Commonly known as: LEXAPRO ?Take 10 mg by mouth daily. ?  ?FISH OIL PO ?Take 700 mg by mouth. ?  ?PROBIOTIC-PREBIOTIC PO ?Take by mouth. ?  ?vitamin D3 50 MCG (2000 UT) Caps ?Take by mouth. ?  ? ?  ? ? ?All past medical history, surgical history, allergies, family history, immunizations andmedications were updated in the EMR today and reviewed under the history and medication portions of their EMR.    ? ?No results found for this or any previous visit (from the past 2160 hour(s)). ? ? ?ROS ?14 pt review of systems performed and negative (unless mentioned in an HPI) ? ?Objective: ?BP 109/69   Pulse 74   Temp 98.2 ?F (36.8 ?C) (Oral)   Ht 5\' 8"  (1.727 m)   Wt 179 lb (81.2 kg)   SpO2 98%   BMI 27.22 kg/m?  ?Physical Exam ?Vitals and nursing note reviewed.  ?Constitutional:   ?   General: She is not in acute distress. ?   Appearance: Normal appearance. She is not ill-appearing or toxic-appearing.  ?HENT:  ?   Head: Normocephalic and atraumatic.  ?   Right Ear: Tympanic membrane, ear canal and external ear normal. There is no impacted cerumen.  ?   Left Ear: Tympanic membrane, ear canal and external ear normal. There is no impacted cerumen.  ?   Nose: No congestion or rhinorrhea.  ?   Mouth/Throat:  ?   Mouth: Mucous membranes are moist.  ?   Pharynx: Oropharynx is clear. No oropharyngeal exudate or posterior oropharyngeal erythema.  ?Eyes:  ?   General: No scleral icterus.    ?   Right eye: No discharge.     ?   Left eye: No discharge.  ?   Extraocular Movements: Extraocular movements intact.  ?   Conjunctiva/sclera: Conjunctivae normal.  ?   Pupils: Pupils are equal, round, and reactive to light.  ?Cardiovascular:  ?   Rate and Rhythm: Normal rate and regular rhythm.  ?   Pulses: Normal pulses.  ?   Heart sounds: Normal heart  sounds. No murmur heard. ?  No friction rub. No gallop.  ?Pulmonary:  ?   Effort: Pulmonary effort is normal. No respiratory distress.  ?  Breath sounds: Normal breath sounds. No stridor. No wheezing, rhonchi or rales.  ?Chest:  ?   Chest wall: No tenderness.  ?Abdominal:  ?   General: Abdomen is flat. Bowel sounds are normal. There is no distension.  ?   Palpations: Abdomen is soft. There is no mass.  ?   Tenderness: There is no abdominal tenderness. There is no right CVA tenderness, left CVA tenderness, guarding or rebound.  ?   Hernia: No hernia is present.  ?Musculoskeletal:     ?   General: No swelling, tenderness or deformity. Normal range of motion.  ?   Cervical back: Normal range of motion and neck supple. No rigidity or tenderness.  ?   Right lower leg: No edema.  ?   Left lower leg: No edema.  ?Lymphadenopathy:  ?   Cervical: No cervical adenopathy.  ?Skin: ?   General: Skin is warm and dry.  ?   Coloration: Skin is not jaundiced or pale.  ?   Findings: No bruising, erythema, lesion or rash.  ?Neurological:  ?   General: No focal deficit present.  ?   Mental Status: She is alert and oriented to person, place, and time. Mental status is at baseline.  ?   Cranial Nerves: No cranial nerve deficit.  ?   Sensory: No sensory deficit.  ?   Motor: No weakness.  ?   Coordination: Coordination normal.  ?   Gait: Gait normal.  ?   Deep Tendon Reflexes: Reflexes normal.  ?Psychiatric:     ?   Mood and Affect: Mood normal.     ?   Behavior: Behavior normal.     ?   Thought Content: Thought content normal.     ?   Judgment: Judgment normal.  ?  ? ?No results found. ? ?Assessment/plan: ?HANAH MOULTRY is a 51 y.o. female present for CPE ?Vegetarian diet ?- CBC with Differential/Platelet ?Abnormal TSH ?- Comprehensive metabolic panel ?- TSH ?Vitamin D deficiency ?- Comprehensive metabolic panel ?- VITAMIN D 25 Hydroxy (Vit-D Deficiency, Fractures) ?Diabetes mellitus screening ?- Hemoglobin A1c ?Lipid screening ?- Lipid  panel ?Memory changes: ?Tsh, vit and b12 collected today.  ?Pt was urged to make a separate appt to evaluate further if labs do not indicate cause of changes.  ?Routine general medical examination at a health care facil

## 2021-05-23 NOTE — Telephone Encounter (Signed)
Please call patient ?Liver, kidney and thyroid function are normal ?Blood cell counts and electrolytes are normal ?Diabetes screening/A1c is normal at 5.9 and her fasting glucose was normal. ?Cholesterol panel looks right and is at goal for her.  Her LDL/bad cholesterol is 82.  That is excellent. ?Vitamin D is just mildly lower than normal at 27.6.  Greater than 30 is normal and for bone health I recommend 40-50 as the level.  I would encourage her to increase her current vitamin D dose by [phone] units daily. ? ?B12 levels will range at 386.  Symptoms can be appreciated levels lower than 400.  I would encourage her to start a daily B12 supplement around [phone] mcg daily. ? ?

## 2021-05-24 NOTE — Telephone Encounter (Signed)
Spoke with pt regarding labs and instructions.   

## 2021-08-30 ENCOUNTER — Encounter: Payer: Self-pay | Admitting: Gastroenterology

## 2021-09-07 ENCOUNTER — Ambulatory Visit (AMBULATORY_SURGERY_CENTER): Payer: Self-pay

## 2021-09-07 VITALS — Ht 68.0 in | Wt 171.0 lb

## 2021-09-07 DIAGNOSIS — Z1211 Encounter for screening for malignant neoplasm of colon: Secondary | ICD-10-CM

## 2021-09-07 MED ORDER — NA SULFATE-K SULFATE-MG SULF 17.5-3.13-1.6 GM/177ML PO SOLN: 1.0000 | ORAL | 0 refills | Status: DC

## 2021-09-07 NOTE — Progress Notes (Signed)
No egg or soy allergy known to patient  No issues known to pt with past sedation with any surgeries or procedures Patient denies ever being told they had issues or difficulty with intubation  No FH of Malignant Hyperthermia Pt is not on diet pills Pt is not on  home 02  Pt is not on blood thinners  Pt reports periodic/monthly issues with constipation  No A fib or A flutter Have any cardiac testing pending--denied Pt instructed to use [email or web address] or GoodRx for a price reduction on prep

## 2021-09-27 ENCOUNTER — Telehealth: Payer: Self-pay

## 2021-09-27 NOTE — Telephone Encounter (Signed)
LM for pt to return call to discuss.  

## 2021-09-27 NOTE — Telephone Encounter (Signed)
Pt called stating that she stepped on a wire/nail. Pt is requesting to have Tdap stating last tdap was in 2015. Last CPE 05/2021. Please advise if approved for nurse visit or if pt needs to have ov with pcp

## 2021-09-27 NOTE — Telephone Encounter (Signed)
If she stepped on it I nail she probably should be seen for issue with a provider.  However if she just wants a nurse visit to get a Tdap she is more than welcome to get it here.

## 2021-09-28 ENCOUNTER — Ambulatory Visit (INDEPENDENT_AMBULATORY_CARE_PROVIDER_SITE_OTHER): Payer: BC Managed Care – PPO

## 2021-09-28 ENCOUNTER — Encounter: Payer: Self-pay | Admitting: Gastroenterology

## 2021-09-28 DIAGNOSIS — Z23 Encounter for immunization: Secondary | ICD-10-CM

## 2021-09-28 NOTE — Telephone Encounter (Signed)
Tdap given via [facility]

## 2021-09-29 DIAGNOSIS — Z23 Encounter for immunization: Secondary | ICD-10-CM

## 2021-10-06 ENCOUNTER — Encounter: Payer: Self-pay | Admitting: Gastroenterology

## 2021-10-06 ENCOUNTER — Ambulatory Visit (AMBULATORY_SURGERY_CENTER): Payer: BC Managed Care – PPO | Admitting: Gastroenterology

## 2021-10-06 VITALS — BP 99/72 | HR 69 | Temp 98.9°F | Resp 15 | Ht 68.0 in | Wt 171.0 lb

## 2021-10-06 DIAGNOSIS — Z1211 Encounter for screening for malignant neoplasm of colon: Secondary | ICD-10-CM

## 2021-10-06 MED ORDER — SODIUM CHLORIDE 0.9 % IV SOLN: 500.0000 mL | Freq: Once | INTRAVENOUS | Status: DC

## 2021-10-06 NOTE — Patient Instructions (Signed)
Thank  you for letting us take care of your healthcare needs today,    YOU HAD AN ENDOSCOPIC PROCEDURE TODAY AT THE [facility]:   Refer to the procedure report that was given to you for any specific questions about what was found during the examination.  If the procedure report does not answer your questions, please call your gastroenterologist to clarify.  If you requested that your care partner not be given the details of your procedure findings, then the procedure report has been included in a sealed envelope for you to review at your convenience later.  YOU SHOULD EXPECT: Some feelings of bloating in the abdomen. Passage of more gas than usual.  Walking can help get rid of the air that was put into your GI tract during the procedure and reduce the bloating. If you had a lower endoscopy (such as a colonoscopy or flexible sigmoidoscopy) you may notice spotting of blood in your stool or on the toilet paper. If you underwent a bowel prep for your procedure, you may not have a normal bowel movement for a few days.  Please Note:  You might notice some irritation and congestion in your nose or some drainage.  This is from the oxygen used during your procedure.  There is no need for concern and it should clear up in a day or so.  SYMPTOMS TO REPORT IMMEDIATELY:  Following lower endoscopy (colonoscopy or flexible sigmoidoscopy):  Excessive amounts of blood in the stool  Significant tenderness or worsening of abdominal pains  Swelling of the abdomen that is new, acute  Fever of 100F or higher   For urgent or emergent issues, a gastroenterologist can be reached at any hour by calling [phone]. Do not use MyChart messaging for urgent concerns.    DIET:  We do recommend a small meal at first, but then you may proceed to your regular diet.  Drink plenty of fluids but you should avoid alcoholic beverages for 24 hours.  ACTIVITY:  You should plan to take it easy for the rest of  today and you should NOT DRIVE or use heavy machinery until tomorrow (because of the sedation medicines used during the test).    FOLLOW UP: Our staff will call the number listed on your records the next business day following your procedure.  We will call around 7:15- 8:00 am to check on you and address any questions or concerns that you may have regarding the information given to you following your procedure. If we do not reach you, we will leave a message.  If you develop any symptoms (ie: fever, flu-like symptoms, shortness of breath, cough etc.) before then, please call [phone].  If you test positive for Covid 19 in the 2 weeks post procedure, please call and report this information to [place].    If any biopsies were taken you will be contacted by phone or by letter within the next 1-3 weeks.  Please call us at [phone] if you have not heard about the biopsies in 3 weeks.    SIGNATURES/CONFIDENTIALITY: You and/or your care partner have signed paperwork which will be entered into your electronic medical record.  These signatures attest to the fact that that the information above on your After Visit Summary has been reviewed and is understood.  Full responsibility of the confidentiality of this discharge information lies with you and/or your care-partner.

## 2021-10-06 NOTE — Op Note (Signed)
[facility] Patient Name: Stacy Bush Procedure Date: 10/06/2021 3:32 PM MRN: [id number] Endoscopist: Tressia Danas MD, MD Age: 51 Referring MD:  Date of Birth: 03/06/1970 Gender: Female Account #: [id number] Procedure:                Colonoscopy Indications:              Screening for colorectal malignant neoplasm, This                            is the patient's first colonoscopy                           No known family history of colon cancer or polyps Medicines:                Monitored Anesthesia Care Procedure:                Pre-Anesthesia Assessment:                           - Prior to the procedure, a History and Physical                            was performed, and patient medications and                            allergies were reviewed. The patient's tolerance of                            previous anesthesia was also reviewed. The risks                            and benefits of the procedure and the sedation                            options and risks were discussed with the patient.                            All questions were answered, and informed consent                            was obtained. Prior Anticoagulants: The patient has                            taken no previous anticoagulant or antiplatelet                            agents. ASA Grade Assessment: II - A patient with                            mild systemic disease. After reviewing the risks                            and benefits, the patient was deemed in  satisfactory condition to undergo the procedure.                           After obtaining informed consent, the colonoscope                            was passed under direct vision. Throughout the                            procedure, the patient's blood pressure, pulse, and                            oxygen saturations were monitored continuously. The                            Olympus CF-HQ190L  ([id number]) Colonoscope was                            introduced through the anus and advanced to the 3                            cm into the ileum. A second forward view of the                            right colon was performed. The colonoscopy was                            performed without difficulty. The patient tolerated                            the procedure well. The quality of the bowel                            preparation was good. The terminal ileum, ileocecal                            valve, appendiceal orifice, and rectum were                            photographed. Scope In: 3:36:23 PM Scope Out: 3:50:33 PM Scope Withdrawal Time: 0 hours 10 minutes 46 seconds  Total Procedure Duration: 0 hours 14 minutes 10 seconds  Findings:                 The perianal and digital rectal examinations were                            normal.                           The colon was mildly redundant. However, the entire                            examined colonic mucosa appeared normal. No polyps  or mass. Complications:            No immediate complications. Estimated Blood Loss:     Estimated blood loss: none. Impression:               - Redundant colon.                           - The examination was otherwise normal on direct                            and retroflexion views.                           - No specimens collected. Recommendation:           - Patient has a contact number available for                            emergencies. The signs and symptoms of potential                            delayed complications were discussed with the                            patient. Return to normal activities tomorrow.                            Written discharge instructions were provided to the                            patient.                           - Resume previous diet.                           - Continue present medications.                            - Repeat colonoscopy in 10 years for surveillance,                            earlier with new symptoms.                           - Emerging evidence supports eating a diet of                            fruits, vegetables, grains, calcium, and yogurt                            while reducing red meat and alcohol may reduce the                            risk of colon cancer.                           -  Thank you for allowing me to be involved in your                            colon cancer prevention. Tressia Danas MD, MD 10/06/2021 3:55:59 PM This report has been signed electronically.

## 2021-10-06 NOTE — Progress Notes (Signed)
   Referring Provider: Natalia Leatherwood, DO Primary Care Physician:  Natalia Leatherwood, DO  Indication for Colonoscopy:  Colon cancer screening   IMPRESSION:  Need for colon cancer screening Appropriate candidate for monitored anesthesia care  PLAN: Colonoscopy in the [facility] today   HPI: Stacy Bush is a 51 y.o. female presents for screening colonoscopy.  No prior colonoscopy or colon cancer screening.  No known family history of colon cancer or polyps. No family history of uterine/endometrial cancer, pancreatic cancer or gastric/stomach cancer.   Past Medical History:  Diagnosis Date   Allergy    Anemia    Anxiety    Cervical radiculopathy 10/04/2016   Depression    GERD (gastroesophageal reflux disease)    Hemorrhoids    Urinary incontinence    pt states with excercise only.     Past Surgical History:  Procedure Laterality Date   WISDOM TOOTH EXTRACTION     2004    Current Outpatient Medications  Medication Sig Dispense Refill   escitalopram (LEXAPRO) 20 MG tablet Take 10 mg by mouth daily.     Flaxseed, Linseed, (FLAXSEED OIL PO) Take by mouth.     Multiple Vitamins-Minerals (ALIVE MULTI-VITAMIN PO) Take by mouth.     Vitamin D, Cholecalciferol, 50 MCG (2000 UT) CAPS Take by mouth.     Current Facility-Administered Medications  Medication Dose Route Frequency Provider Last Rate Last Admin   0.9 %  sodium chloride infusion  500 mL Intravenous Once Tressia Danas, MD        Allergies as of 10/06/2021   (No Known Allergies)    Family History  Problem Relation Age of Onset   Hearing loss Mother    COPD Father    Heart disease Father    Other Father        Barretts   Drug abuse Brother    Alzheimer's disease Maternal Grandfather    Diabetes Paternal Grandmother    Drug abuse Paternal Grandfather    Lung cancer Paternal Grandfather    Breast cancer Neg Hx    Colon cancer Neg Hx    Esophageal cancer Neg Hx    Stomach cancer Neg Hx    Ulcerative  colitis Neg Hx      Physical Exam: General:   Alert,  well-nourished, pleasant and cooperative in NAD Head:  Normocephalic and atraumatic. Eyes:  Sclera clear, no icterus.   Conjunctiva pink. Mouth:  No deformity or lesions.   Neck:  Supple; no masses or thyromegaly. Lungs:  Clear throughout to auscultation.   No wheezes. Heart:  Regular rate and rhythm; no murmurs. Abdomen:  Soft, non-tender, nondistended, normal bowel sounds, no rebound or guarding.  Msk:  Symmetrical. No boney deformities LAD: No inguinal or umbilical LAD Extremities:  No clubbing or edema. Neurologic:  Alert and  oriented x4;  grossly nonfocal Skin:  No obvious rash or bruise. Psych:  Alert and cooperative. Normal mood and affect.     Studies/Results: No results found.    Beckhem Isadore L. Orvan Falconer, MD, MPH 10/06/2021, 3:27 PM

## 2021-10-06 NOTE — Progress Notes (Signed)
Pt's states no medical or surgical changes since previsit or office visit. VS assessed by D.T 

## 2021-10-06 NOTE — Progress Notes (Signed)
To pacu, VSS. Report to [email or web address] 

## 2021-10-07 ENCOUNTER — Telehealth: Payer: Self-pay | Admitting: *Deleted

## 2021-10-07 NOTE — Telephone Encounter (Signed)
  Follow up Call-     10/06/2021    2:04 PM  Call back number  Post procedure Call Back phone  # [phone]  Permission to leave phone message Yes     Patient questions:  Message left to call us if necessary.

## 2022-04-19 ENCOUNTER — Other Ambulatory Visit: Payer: Self-pay

## 2022-04-28 ENCOUNTER — Ambulatory Visit: Payer: BC Managed Care – PPO | Admitting: Family Medicine

## 2022-04-28 ENCOUNTER — Encounter: Payer: Self-pay | Admitting: Family Medicine

## 2022-04-28 VITALS — BP 117/79 | HR 81 | Temp 97.8°F | Wt 179.0 lb

## 2022-04-28 DIAGNOSIS — K5901 Slow transit constipation: Secondary | ICD-10-CM

## 2022-04-28 MED ORDER — SENNOSIDES 8.6 MG PO TABS: 2.0000 | ORAL_TABLET | Freq: Every day | ORAL | 5 refills | Status: AC

## 2022-04-28 MED ORDER — BISACODYL 10 MG RE SUPP: 10.0000 mg | RECTAL | 0 refills | Status: DC | PRN

## 2022-04-28 NOTE — Patient Instructions (Addendum)
No follow-ups on file.  Senakot 2 tabs before bed.  Fleets enema or suppository.  Miralax 1 cap in a bottle water twice a day.    We can consider amitiza        Great to see you today.  I have refilled the medication(s) we provide.   If labs were collected, we will inform you of lab results once received either by echart message or telephone call.   - echart message- for normal results that have been seen by the patient already.   - telephone call: abnormal results or if patient has not viewed results in their echart.

## 2022-04-28 NOTE — Progress Notes (Signed)
Stacy Bush , 1970/06/25, 52 y.o., female MRN: [id number] Patient Care Team    Relationship Specialty Notifications Start End  Ma Hillock, DO PCP - General Family Medicine  12/18/14   Paula Compton, MD Consulting Physician Obstetrics and Gynecology  10/10/19     Chief Complaint  Patient presents with   digestion concerns    Constipation' has not had BM in 2 weeks; has taken laxative and had loose stool but has not had a full BM     Subjective: Stacy Bush is a 52 y.o. Pt presents for an OV with complaints of digestion concerns of 2 weeks duration.  Associated symptoms include patient reports she was having lower abdominal cramping and significant tenesmus pain that she called EMS and eventually went to the emergency room.  She had not been seen in the emergency room secondary to the weight and the symptoms resolved while she was waiting.  She then had rather significant abdominal cramping and went to the urgent care which performed an x-ray of the showed a large stool burden per patient.  She was told to do prune juice and milk of magnesia warmed, which she did and it produced a fair amount of watery diarrhea.  She states she still feels like she is not having a normal stool.  She either has diarrhea if she does the above regimen or she is only having very small levels of stool.  She states she feels full and bloated. She has had difficulty in the past with her bowels and has chronic constipation at least intermittently.  During her menstrual cycles she always had looser stools. Currently she is using senna 8.5 mg tabs on the weekends and Dulcolax with meals.       05/23/2021    9:03 AM 06/28/2020   11:39 AM 10/10/2019    9:10 AM 01/07/2018    5:35 PM 05/03/2017   11:04 AM  Depression screen PHQ 2/9  Decreased Interest 0 0 0 0 3  Down, Depressed, Hopeless 0 0 0 0 3  PHQ - 2 Score 0 0 0 0 6  Altered sleeping 0    1  Tired, decreased energy 0    2  Change in appetite 2    2   Feeling bad or failure about yourself  0    3  Trouble concentrating 0    1  Moving slowly or fidgety/restless 0    1  Suicidal thoughts 0    1  PHQ-9 Score 2    17    No Known Allergies Social History   Social History Narrative   Married. Spouse's name is Engineer, technical sales. They have 1 child, named Chief Executive Officer .    Firefighter. Master's Degree.   Patient drinks caffeine, uses herbal remedies, takes a daily vitamin.   Patient wears her seatbelt, she wears a bike helmet. She exercises at least 3 times a week.   Patient does report a vegetarian diet.   There is a smoke detector in her home, she feels safe in her relationships.   Past Medical History:  Diagnosis Date   Allergy    Anemia    Anxiety    Cervical radiculopathy 10/04/2016   Depression    GERD (gastroesophageal reflux disease)    Hemorrhoids    Urinary incontinence    pt states with excercise only.    Past Surgical History:  Procedure Laterality Date   WISDOM TOOTH EXTRACTION  2004   Family History  Problem Relation Age of Onset   Hearing loss Mother    COPD Father    Heart disease Father    Other Father        Barretts   Drug abuse Brother    Alzheimer's disease Maternal Grandfather    Diabetes Paternal Grandmother    Drug abuse Paternal Grandfather    Lung cancer Paternal Grandfather    Breast cancer Neg Hx    Colon cancer Neg Hx    Esophageal cancer Neg Hx    Stomach cancer Neg Hx    Ulcerative colitis Neg Hx    Allergies as of 04/28/2022   No Known Allergies      Medication List        Accurate as of April 28, 2022 11:45 AM. If you have any questions, ask your nurse or doctor.          ALIVE MULTI-VITAMIN PO Take by mouth.   bisacodyl 10 MG suppository Commonly known as: Dulcolax Place 1 suppository (10 mg total) rectally as needed for moderate constipation. Started by: Howard Pouch, DO   escitalopram 20 MG tablet Commonly known as: LEXAPRO Take 10 mg by mouth daily.   FLAXSEED OIL  PO Take by mouth.   senna 8.6 MG tablet Commonly known as: SENOKOT Take 2 tablets (17.2 mg total) by mouth at bedtime. Started by: Howard Pouch, DO   vitamin D3 50 MCG (2000 UT) Caps Take by mouth.        All past medical history, surgical history, allergies, family history, immunizations andmedications were updated in the EMR today and reviewed under the history and medication portions of their EMR.     ROS Negative, with the exception of above mentioned in HPI   Objective:  BP 117/79   Pulse 81   Temp 97.8 F (36.6 C)   Wt 179 lb (81.2 kg)   LMP 04/27/2022   SpO2 97%   BMI 27.22 kg/m  Body mass index is 27.22 kg/m.  Physical Exam Vitals and nursing note reviewed.  Constitutional:      General: She is not in acute distress.    Appearance: Normal appearance. She is normal weight. She is not ill-appearing or toxic-appearing.  HENT:     Head: Normocephalic and atraumatic.  Eyes:     General: No scleral icterus.       Right eye: No discharge.        Left eye: No discharge.     Extraocular Movements: Extraocular movements intact.     Conjunctiva/sclera: Conjunctivae normal.     Pupils: Pupils are equal, round, and reactive to light.  Abdominal:     General: Abdomen is flat. Bowel sounds are normal. There is distension.     Palpations: Abdomen is soft. There is no mass.     Tenderness: There is no abdominal tenderness. There is no guarding or rebound.  Musculoskeletal:     Right lower leg: No edema.     Left lower leg: No edema.  Skin:    Findings: No rash.  Neurological:     Mental Status: She is alert and oriented to person, place, and time. Mental status is at baseline.     Motor: No weakness.     Coordination: Coordination normal.     Gait: Gait normal.  Psychiatric:        Mood and Affect: Mood normal.        Behavior: Behavior normal.  Thought Content: Thought content normal.        Judgment: Judgment normal.     No results found. No  results found. No results found for this or any previous visit (from the past 24 hour(s)).  Assessment/Plan: Stacy Bush is a 52 y.o. female present for OV for  Slow transit constipation-abdominal cramping Patient has had difficulty with her bowel habits for many years.  Seems to have worsened over the last few weeks for her. We discussed Amitiza today, it is on her formulary, she does not really want to start a prescribed medication. Encouraged her to take Senokot 2 tabs every night before bed, MiraLAX 1 cap in a bottle water twice daily until bowel movements are routine.  Can then taper back off the MiraLAX as long as bowel movements remain routine. Dulcolax suppository prescribed for once daily if needed. Encouraged her to have a fleets enema on hand in the event the Dulcolax suppository does not produce stool. We will call back and let us know if she decides to start the [place].   Reviewed expectations re: course of current medical issues. Discussed self-management of symptoms. Outlined signs and symptoms indicating need for more acute intervention. Patient verbalized understanding and all questions were answered. Patient received an After-Visit Summary.    No orders of the defined types were placed in this encounter.  Meds ordered this encounter  Medications   bisacodyl (DULCOLAX) 10 MG suppository    Sig: Place 1 suppository (10 mg total) rectally as needed for moderate constipation.    Dispense:  12 suppository    Refill:  0   senna (SENOKOT) 8.6 MG tablet    Sig: Take 2 tablets (17.2 mg total) by mouth at bedtime.    Dispense:  60 tablet    Refill:  5   Referral Orders  No referral(s) requested today     Note is dictated utilizing voice recognition software. Although note has been proof read prior to signing, occasional typographical errors still can be missed. If any questions arise, please do not hesitate to call for verification.   electronically signed  by:  Howard Pouch, DO  [place]

## 2023-05-08 LAB — HM MAMMOGRAPHY

## 2023-05-11 LAB — HM PAP SMEAR: HPV, high-risk: NEGATIVE

## 2024-02-05 NOTE — Progress Notes (Signed)
 " Cardiology Office Note   Date:  02/15/2024  ID:  Stacy Bush, DOB February 04, 1971, MRN [id number] PCP: Catherine Charlies LABOR, DO  [facility] Providers Cardiologist:  Annabella Scarce, MD    History of Present Illness Stacy Bush is a 53 y.o. female without significant medical history who presents today for evaluation of palpitations.  Patient is followed by [facility] primary care.  Through them she recently wore a cardiac monitor that showed predominantly normal sinus rhythm with an average heart rate of 79 bpm.  There was 1 episode of SVT lasting 11 beats.  Rare PVCs and PACs.  Lab work through PCP on 12/19/2023 significant for hemoglobin 14.0, TSH within normal limits, low vitamin D   Today, patient presents to discuss her palpitations.  Says that for the past 5 months or so, she has been having multiple episodes of palpitations per day.  She also occasionally feels like she skips a beat at times. Notes that recently she has been going through menopause, and she wonders if her hormonal changes have been contributing. Notes that about 2-3 weeks ago, her palpitations improved quite a bit. She got her period recently. She had worn a cardiac monitor though her primary care provider, and we reviewed the reassuring results. She was having episodes of palpitations while wearing the monitor.   She denies having any other cardiac symptoms.  Denies chest pain shortness of breath, lower extremity swelling, dizziness, syncope or near syncope.  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 She is able to tolerate activity well. No fatigue. She does not drink caffeine and does a good job staying hydrated throughout the day.    Studies Reviewed EKG Interpretation Date/Time:  Friday February 15 2024 13:26:37 EST Ventricular Rate:  86 PR Interval:  142 QRS Duration:  92 QT Interval:  378 QTC Calculation: 452 R Axis:   -60  Text Interpretation: Normal sinus  rhythm Left anterior fascicular block No previous ECGs available Confirmed by Vicci Sauer [phone]) on 02/15/2024 2:09:03 PM    Reviewed zio patch from 01/2924 (scanned under Media tab under Referral Note)    Risk Assessment/Calculations           Physical Exam VS:  BP 120/80   Pulse 86   Ht 5' 7 (1.702 m)   Wt 169 lb (76.7 kg)   SpO2 97%   BMI 26.47 kg/m        Wt Readings from Last 3 Encounters:  02/15/24 169 lb (76.7 kg)  04/28/22 179 lb (81.2 kg)  10/06/21 171 lb (77.6 kg)    GEN: Well nourished, well developed in no acute distress. Sitting comfortably on the exam table  NECK: No JVD CARDIAC: RRR, no murmurs, rubs, gallops. Radial pulses 2+ bilaterally  RESPIRATORY:  Clear to auscultation without rales, wheezing or rhonchi. Normal WOB on room air   ABDOMEN: Soft, non-tender, non-distended EXTREMITIES:  No edema; No deformity   ASSESSMENT AND PLAN  Palpitations - Patient tells me that she has been having episodes of palpitations for the past 5 months or so. Did ease up 2 weeks ago. Palpitations can occur multiple times per day. She also has occasional feelings like her heart skips a beat  - Wore a cardiac monitor through her primary care provider.  Showed predominantly normal sinus with an average heart rate of 79 bpm.  There was 1 episode of SVT that lasted 11 beats.  There were rare PVCs  and rare PVCs. - Patient does not drink caffeine or alcohol. Does a good job staying hydrated throughout the day. Has not been under increased stress recently. TSH normal in 10/2023.  - Patient is perimenopausal. Possible that hormonal fluctuations are contributing to symptoms.  - EKG today reassuring  - Reviewed her recent reassuring cardiac monitor. Discussed triggers for palpitations - Offered to start low dose metoprolol. Patient would prefer to avoid staring medications at this time. She wants to continue conservative management. As her monitor was reassuring, I agree that this is  reasonable   Dispo: Follow up as needed (requests to follow at [facility], will assign to Dr. Raford)   Signed, Rollo FABIENE Louder, PA-C   "

## 2024-02-15 ENCOUNTER — Ambulatory Visit: Attending: Cardiology | Admitting: Cardiology

## 2024-02-15 ENCOUNTER — Encounter: Payer: Self-pay | Admitting: Cardiology

## 2024-02-15 VITALS — BP 120/80 | HR 86 | Ht 67.0 in | Wt 169.0 lb

## 2024-02-15 DIAGNOSIS — R002 Palpitations: Secondary | ICD-10-CM

## 2024-02-15 NOTE — Patient Instructions (Signed)
 Medication Instructions:  Your physician recommends that you continue on your current medications as directed. Please refer to the Current Medication list given to you today.  *If you need a refill on your cardiac medications before your next appointment, please call your pharmacy*  Lab Work: None ordered  If you have labs (blood work) drawn today and your tests are completely normal, you will receive your results only by: MyChart Message (if you have MyChart) OR A paper copy in the mail If you have any lab test that is abnormal or we need to change your treatment, we will call you to review the results.  Testing/Procedures: None ordered  Follow-Up: At [facility], you and your health needs are our priority.  As part of our continuing mission to provide you with exceptional heart care, our providers are all part of one team.  This team includes your primary Cardiologist (physician) and Advanced Practice Providers or APPs (Physician Assistants and Nurse Practitioners) who all work together to provide you with the care you need, when you need it.  Your next appointment:   As needed  Provider:   Annabella Scarce, MD    We recommend signing up for the patient portal called MyChart.  Sign up information is provided on this After Visit Summary.  MyChart is used to connect with patients for Virtual Visits (Telemedicine).  Patients are able to view lab/test results, encounter notes, upcoming appointments, etc.  Non-urgent messages can be sent to your provider as well.   To learn more about what you can do with MyChart, go to [email or web address].   Other Instructions
